# Patient Record
Sex: Female | Born: 1950 | Race: White | Hispanic: No | Marital: Married | Smoking: Never smoker
Health system: Southern US, Community
[De-identification: ages and names within clinical notes are randomized; demographics above are authoritative.]

## PROBLEM LIST (undated history)

## (undated) DIAGNOSIS — E785 Hyperlipidemia, unspecified: Secondary | ICD-10-CM

## (undated) DIAGNOSIS — B029 Zoster without complications: Secondary | ICD-10-CM

## (undated) DIAGNOSIS — I10 Essential (primary) hypertension: Secondary | ICD-10-CM

## (undated) DIAGNOSIS — L570 Actinic keratosis: Secondary | ICD-10-CM

## (undated) DIAGNOSIS — C4491 Basal cell carcinoma of skin, unspecified: Secondary | ICD-10-CM

## (undated) DIAGNOSIS — C4490 Unspecified malignant neoplasm of skin, unspecified: Secondary | ICD-10-CM

## (undated) DIAGNOSIS — N390 Urinary tract infection, site not specified: Secondary | ICD-10-CM

## (undated) DIAGNOSIS — G629 Polyneuropathy, unspecified: Secondary | ICD-10-CM

## (undated) DIAGNOSIS — A419 Sepsis, unspecified organism: Secondary | ICD-10-CM

## (undated) HISTORY — PX: BLADDER SURGERY: SHX569

## (undated) HISTORY — PX: SKIN CANCER EXCISION: SHX779

## (undated) HISTORY — PX: APPENDECTOMY: SHX54

## (undated) HISTORY — PX: ABDOMINAL HYSTERECTOMY: SHX81

---

## 1999-04-22 ENCOUNTER — Other Ambulatory Visit: Admission: RE | Admit: 1999-04-22 | Discharge: 1999-04-22 | Payer: Self-pay

## 2014-06-16 ENCOUNTER — Emergency Department (HOSPITAL_BASED_OUTPATIENT_CLINIC_OR_DEPARTMENT_OTHER): Payer: Medicare Other

## 2014-06-16 ENCOUNTER — Emergency Department (HOSPITAL_BASED_OUTPATIENT_CLINIC_OR_DEPARTMENT_OTHER)
Admission: EM | Admit: 2014-06-16 | Discharge: 2014-06-16 | Disposition: A | Discharge status: Home / Self Care | Payer: Medicare Other | Attending: Emergency Medicine | Admitting: Emergency Medicine

## 2014-06-16 ENCOUNTER — Encounter (HOSPITAL_BASED_OUTPATIENT_CLINIC_OR_DEPARTMENT_OTHER): Payer: Self-pay

## 2014-06-16 DIAGNOSIS — Z85828 Personal history of other malignant neoplasm of skin: Secondary | ICD-10-CM | POA: Insufficient documentation

## 2014-06-16 DIAGNOSIS — Y998 Other external cause status: Secondary | ICD-10-CM | POA: Diagnosis not present

## 2014-06-16 DIAGNOSIS — Z8669 Personal history of other diseases of the nervous system and sense organs: Secondary | ICD-10-CM | POA: Insufficient documentation

## 2014-06-16 DIAGNOSIS — Y9389 Activity, other specified: Secondary | ICD-10-CM | POA: Diagnosis not present

## 2014-06-16 DIAGNOSIS — I10 Essential (primary) hypertension: Secondary | ICD-10-CM | POA: Diagnosis not present

## 2014-06-16 DIAGNOSIS — Z720 Tobacco use: Secondary | ICD-10-CM | POA: Insufficient documentation

## 2014-06-16 DIAGNOSIS — Y9289 Other specified places as the place of occurrence of the external cause: Secondary | ICD-10-CM | POA: Diagnosis not present

## 2014-06-16 DIAGNOSIS — S0990XA Unspecified injury of head, initial encounter: Secondary | ICD-10-CM | POA: Diagnosis present

## 2014-06-16 DIAGNOSIS — Z79899 Other long term (current) drug therapy: Secondary | ICD-10-CM | POA: Insufficient documentation

## 2014-06-16 DIAGNOSIS — W19XXXA Unspecified fall, initial encounter: Secondary | ICD-10-CM

## 2014-06-16 DIAGNOSIS — W109XXA Fall (on) (from) unspecified stairs and steps, initial encounter: Secondary | ICD-10-CM | POA: Diagnosis not present

## 2014-06-16 HISTORY — DX: Unspecified malignant neoplasm of skin, unspecified: C44.90

## 2014-06-16 HISTORY — DX: Polyneuropathy, unspecified: G62.9

## 2014-06-16 HISTORY — DX: Essential (primary) hypertension: I10

## 2014-06-16 NOTE — ED Notes (Addendum)
Tripped up stairs last night-struck front of head on tile-?LOC-abrasion to right forehead-dizzy this am-denies n/v, gait disturbance

## 2014-06-16 NOTE — Discharge Instructions (Signed)
Please follow the directions provided.  Be sure to follow-up with your primary care provider to ensure you are getting better.  You CT scans were negative for any fracture or abnormality.  You may take tylenol for pain. Don't hesitate to return for any new, worsening or concerning symptoms.     WHEN SHOULD I SEEK IMMEDIATE MEDICAL CARE?  You should get help right away if:  You have confusion or drowsiness.  You feel sick to your stomach (nauseous) or have continued, forceful vomiting.  You have dizziness or unsteadiness that is getting worse.  You have severe, continued headaches not relieved by medicine. Only take over-the-counter or prescription medicines for pain, fever, or discomfort as directed by your health care provider.  You do not have normal function of the arms or legs or are unable to walk.  You notice changes in the black spots in the center of the colored part of your eye (pupil).  You have a clear or bloody fluid coming from your nose or ears.  You have a loss of vision. During the next 24 hours after the injury, you must stay with someone who can watch you for the warning signs. This person should contact local emergency services (911 in the [place] if you have seizures, you become unconscious, or you are unable to wake up.

## 2014-06-16 NOTE — ED Provider Notes (Signed)
CSN: [id number]     Arrival date & time 06/16/14  1759 History   First MD Initiated Contact with Patient 06/16/14 1929     Chief Complaint  Patient presents with  . Head Injury   (Consider location/radiation/quality/duration/timing/severity/associated sxs/prior Treatment) HPI Lauren Travis is a 63 yo female presenting with report of tripping while walking up the steps and hitting her right cheek and forehead. She denies any dizziness when she tripped but reports mis-stepping while climbing stairs. Her daughter noticed she had bruising to her forehead and encouraged her to come in today to be evaluated. She is not on any blood thinners and denies any chest pain, shortness of breath, light-headedness,  LOC, nausea, vomiting, blurred vision, or headache.   Past Medical History  Diagnosis Date  . Peripheral neuropathy   . Skin cancer   . Hypertension    Past Surgical History  Procedure Laterality Date  . Abdominal hysterectomy    . Bladder surgery    . Appendectomy     No family history on file. History  Substance Use Topics  . Smoking status: Current Every Day Smoker  . Smokeless tobacco: Not on file  . Alcohol Use: Yes   OB History    No data available     Review of Systems  Constitutional: Negative for fever and chills.  HENT: Negative for sore throat.   Eyes: Negative for visual disturbance.  Respiratory: Negative for cough and shortness of breath.   Cardiovascular: Negative for chest pain and leg swelling.  Gastrointestinal: Negative for nausea, vomiting and diarrhea.  Genitourinary: Negative for dysuria.  Musculoskeletal: Negative for myalgias.  Skin: Positive for color change. Negative for rash.  Neurological: Negative for weakness, numbness and headaches.      Allergies  Morphine and related  Home Medications   Prior to Admission medications   Medication Sig Start Date End Date Taking? Authorizing Provider  Estrogens Conjugated (PREMARIN PO) Take by  mouth.   Yes Historical Provider, MD  Gabapentin (NEURONTIN PO) Take by mouth.   Yes Historical Provider, MD  PROPRANOLOL HCL PO Take by mouth.   Yes Historical Provider, MD   BP 146/88 mmHg  Pulse 58  Temp(Src) 98 F (36.7 C) (Oral)  Resp 18  Ht 5\' 3"  (1.6 m)  Wt 134 lb (60.782 kg)  BMI 23.74 kg/m2  SpO2 100% Physical Exam  Constitutional: She is oriented to person, place, and time. She appears well-developed and well-nourished. No distress.  HENT:  Head: Normocephalic. Head is with contusion.    Mouth/Throat: Oropharynx is clear and moist. No oropharyngeal exudate.  Eyes: Conjunctivae are normal. Pupils are equal, round, and reactive to light.  Neck: Neck supple. No thyromegaly present.  Cardiovascular: Normal rate, regular rhythm and intact distal pulses.   Pulmonary/Chest: Effort normal and breath sounds normal. No respiratory distress. She has no wheezes. She has no rales. She exhibits no tenderness.  Abdominal: Soft. There is no tenderness.  Musculoskeletal: She exhibits no tenderness.  Lymphadenopathy:    She has no cervical adenopathy.  Neurological: She is alert and oriented to person, place, and time. She has normal strength. No cranial nerve deficit or sensory deficit. She exhibits normal muscle tone. Coordination normal. GCS eye subscore is 4. GCS verbal subscore is 5. GCS motor subscore is 6.  Cranial nerves 2-12 intact.  Skin: Skin is warm and dry. No rash noted. She is not diaphoretic.  Psychiatric: She has a normal mood and affect.  Nursing note and vitals  reviewed.   ED Course  Procedures (including critical care time) Labs Review Labs Reviewed - No data to display  Imaging Review EXAM: CT HEAD WITHOUT CONTRAST  CT MAXILLOFACIAL WITHOUT CONTRAST  TECHNIQUE: Multidetector CT imaging of the head and maxillofacial structures were performed using the standard protocol without intravenous contrast. Multiplanar CT image reconstructions of the  maxillofacial structures were also generated.  COMPARISON: None.  FINDINGS: CT HEAD FINDINGS  No evidence of parenchymal hemorrhage or extra-axial fluid collection. No mass lesion, mass effect, or midline shift.  No CT evidence of acute infarction.  Mild prominence of the bifrontal extra-axial spaces. No ventriculomegaly.  The visualized paranasal sinuses are essentially clear. The mastoid air cells are unopacified.  No evidence of calvarial fracture.  CT MAXILLOFACIAL FINDINGS  No evidence of maxillofacial fracture.  The bilateral orbits, including the globes and retroconal soft tissues, are within normal limits.  The mandible is intact. The bilateral mandibular condyles are well-seated in the TMJs.  The visualized paranasal sinuses are essentially clear. The mastoid air cells are unopacified.  Cervical spine is intact to C6-7.  IMPRESSION: No evidence of acute intracranial abnormality.  No evidence of maxillofacial fracture.    EKG Interpretation None      MDM   Final diagnoses:  Fall   63 yo with bruising to rt zygoma and rt forehead after fall from standing.  No report of chest pain, shortness of breath or syncope related to fall.  CT head and maxillofacial negative for acute abnormality. Pt is alert and without complaints.  Vital signs stable and no acute ditsress noted. Head injury sheet provided.  Pt encouraged to follow-up with her PCP.  Return precautions provided.  Pt aware of plan and in agreement.     Filed Vitals:   06/16/14 1818 06/16/14 2029 06/16/14 2128  BP: 146/88 138/79 131/78  Pulse: 58 47 50  Temp: 98 F (36.7 C) 97.6 F (36.4 C) 97.7 F (36.5 C)  TempSrc: Oral Oral Oral  Resp: 18 18 20   Height: 5\' 3"  (1.6 m)    Weight: 134 lb (60.782 kg)    SpO2: 100% 99% 99%   Meds given in ED:  Medications - No data to display  Discharge Medication List as of 06/16/2014  9:26 PM         Britt Bottom, NP 06/18/14  [place], MD 06/24/14 [phone]

## 2019-08-19 ENCOUNTER — Ambulatory Visit: Payer: Self-pay

## 2021-02-15 HISTORY — PX: ANTERIOR (CYSTOCELE) AND POSTERIOR REPAIR (RECTOCELE) WITH XENFORM GRAFT AND SACROSPINOUS FIXATION: SHX6492

## 2021-05-26 ENCOUNTER — Other Ambulatory Visit: Payer: Self-pay

## 2021-05-26 ENCOUNTER — Encounter (HOSPITAL_BASED_OUTPATIENT_CLINIC_OR_DEPARTMENT_OTHER): Payer: Self-pay

## 2021-05-26 ENCOUNTER — Inpatient Hospital Stay (HOSPITAL_BASED_OUTPATIENT_CLINIC_OR_DEPARTMENT_OTHER)
Admission: EM | Admit: 2021-05-26 | Discharge: 2021-05-28 | DRG: 872 | Disposition: A | Discharge status: Home / Self Care | Payer: Medicare Other | Attending: Internal Medicine | Admitting: Internal Medicine

## 2021-05-26 ENCOUNTER — Emergency Department (HOSPITAL_BASED_OUTPATIENT_CLINIC_OR_DEPARTMENT_OTHER): Payer: Medicare Other

## 2021-05-26 DIAGNOSIS — E782 Mixed hyperlipidemia: Secondary | ICD-10-CM | POA: Diagnosis present

## 2021-05-26 DIAGNOSIS — I10 Essential (primary) hypertension: Secondary | ICD-10-CM | POA: Diagnosis present

## 2021-05-26 DIAGNOSIS — B029 Zoster without complications: Secondary | ICD-10-CM | POA: Diagnosis present

## 2021-05-26 DIAGNOSIS — R6510 Systemic inflammatory response syndrome (SIRS) of non-infectious origin without acute organ dysfunction: Secondary | ICD-10-CM | POA: Diagnosis present

## 2021-05-26 DIAGNOSIS — R509 Fever, unspecified: Principal | ICD-10-CM

## 2021-05-26 DIAGNOSIS — N390 Urinary tract infection, site not specified: Secondary | ICD-10-CM

## 2021-05-26 DIAGNOSIS — E876 Hypokalemia: Secondary | ICD-10-CM | POA: Diagnosis present

## 2021-05-26 DIAGNOSIS — A419 Sepsis, unspecified organism: Secondary | ICD-10-CM | POA: Diagnosis present

## 2021-05-26 DIAGNOSIS — N3000 Acute cystitis without hematuria: Secondary | ICD-10-CM | POA: Diagnosis present

## 2021-05-26 DIAGNOSIS — E871 Hypo-osmolality and hyponatremia: Secondary | ICD-10-CM | POA: Diagnosis present

## 2021-05-26 DIAGNOSIS — Z79899 Other long term (current) drug therapy: Secondary | ICD-10-CM

## 2021-05-26 DIAGNOSIS — G629 Polyneuropathy, unspecified: Secondary | ICD-10-CM | POA: Diagnosis present

## 2021-05-26 DIAGNOSIS — A4181 Sepsis due to Enterococcus: Secondary | ICD-10-CM | POA: Diagnosis not present

## 2021-05-26 DIAGNOSIS — Z20822 Contact with and (suspected) exposure to covid-19: Secondary | ICD-10-CM | POA: Diagnosis present

## 2021-05-26 DIAGNOSIS — E869 Volume depletion, unspecified: Secondary | ICD-10-CM | POA: Diagnosis present

## 2021-05-26 DIAGNOSIS — Z90710 Acquired absence of both cervix and uterus: Secondary | ICD-10-CM

## 2021-05-26 DIAGNOSIS — Z85828 Personal history of other malignant neoplasm of skin: Secondary | ICD-10-CM

## 2021-05-26 DIAGNOSIS — R7303 Prediabetes: Secondary | ICD-10-CM | POA: Diagnosis present

## 2021-05-26 HISTORY — DX: Hyperlipidemia, unspecified: E78.5

## 2021-05-26 HISTORY — DX: Zoster without complications: B02.9

## 2021-05-26 HISTORY — DX: Essential (primary) hypertension: I10

## 2021-05-26 HISTORY — DX: Basal cell carcinoma of skin, unspecified: C44.91

## 2021-05-26 HISTORY — DX: Actinic keratosis: L57.0

## 2021-05-26 LAB — COMPREHENSIVE METABOLIC PANEL
ALT: 29 U/L (ref 0–44)
AST: 26 U/L (ref 15–41)
Albumin: 4.2 g/dL (ref 3.5–5.0)
Alkaline Phosphatase: 53 U/L (ref 38–126)
Anion gap: 14 (ref 5–15)
BUN: 12 mg/dL (ref 8–23)
CO2: 25 mmol/L (ref 22–32)
Calcium: 9.5 mg/dL (ref 8.9–10.3)
Chloride: 93 mmol/L — ABNORMAL LOW (ref 98–111)
Creatinine, Ser: 0.87 mg/dL (ref 0.44–1.00)
GFR, Estimated: >60 mL/min (ref >60)
Glucose, Bld: 139 mg/dL — ABNORMAL HIGH (ref 70–99)
Potassium: 3.1 mmol/L — ABNORMAL LOW (ref 3.5–5.1)
Sodium: 132 mmol/L — ABNORMAL LOW (ref 135–145)
Total Bilirubin: 0.9 mg/dL (ref 0.3–1.2)
Total Protein: 8.1 g/dL (ref 6.5–8.1)

## 2021-05-26 LAB — RAPID INFLUENZA A&B ANTIGENS
Influenza A (ARMC): NEGATIVE
Influenza B (ARMC): NEGATIVE

## 2021-05-26 LAB — CBC WITH DIFFERENTIAL/PLATELET
Abs Immature Granulocytes: 0.07 10*3/uL (ref 0.00–0.07)
Basophils Absolute: 0 10*3/uL (ref 0.0–0.1)
Basophils Relative: 0 %
Eosinophils Absolute: 0 10*3/uL (ref 0.0–0.5)
Eosinophils Relative: 0 %
HCT: 40.5 % (ref 36.0–46.0)
Hemoglobin: 13.7 g/dL (ref 12.0–15.0)
Immature Granulocytes: 1 %
Lymphocytes Relative: 13 %
Lymphs Abs: 1.7 10*3/uL (ref 0.7–4.0)
MCH: 30.4 pg (ref 26.0–34.0)
MCHC: 33.8 g/dL (ref 30.0–36.0)
MCV: 89.8 fL (ref 80.0–100.0)
Monocytes Absolute: 1 10*3/uL (ref 0.1–1.0)
Monocytes Relative: 8 %
Neutro Abs: 10.5 10*3/uL — ABNORMAL HIGH (ref 1.7–7.7)
Neutrophils Relative %: 78 %
Platelets: 181 10*3/uL (ref 150–400)
RBC: 4.51 MIL/uL (ref 3.87–5.11)
RDW: 13.9 % (ref 11.5–15.5)
WBC: 13.3 10*3/uL — ABNORMAL HIGH (ref 4.0–10.5)
nRBC: 0 % (ref 0.0–0.2)

## 2021-05-26 LAB — URINALYSIS, ROUTINE W REFLEX MICROSCOPIC
Bilirubin Urine: NEGATIVE
Glucose, UA: NEGATIVE mg/dL
Ketones, ur: NEGATIVE mg/dL
Nitrite: POSITIVE — AB
Protein, ur: NEGATIVE mg/dL
Specific Gravity, Urine: 1.015 (ref 1.005–1.030)
pH: 5.5 (ref 5.0–8.0)

## 2021-05-26 LAB — RESP PANEL BY RT-PCR (FLU A&B, COVID) ARPGX2
Influenza A by PCR: NEGATIVE
Influenza B by PCR: NEGATIVE
SARS Coronavirus 2 by RT PCR: NEGATIVE

## 2021-05-26 LAB — URINALYSIS, MICROSCOPIC (REFLEX)

## 2021-05-26 MED ORDER — ONDANSETRON HCL 4 MG/2ML IJ SOLN
4.0000 mg | Freq: Once | INTRAMUSCULAR | Status: AC
  Administered 2021-05-26: 4 mg via INTRAVENOUS
  Filled 2021-05-26: qty 2

## 2021-05-26 MED ORDER — ACETAMINOPHEN 325 MG PO TABS
650.0000 mg | ORAL_TABLET | Freq: Once | ORAL | Status: AC
  Administered 2021-05-26: 650 mg via ORAL
  Filled 2021-05-26: qty 2

## 2021-05-26 MED ORDER — SODIUM CHLORIDE 0.9 % IV BOLUS
500.0000 mL | Freq: Once | INTRAVENOUS | Status: AC
  Administered 2021-05-26: 500 mL via INTRAVENOUS

## 2021-05-26 MED ORDER — SODIUM CHLORIDE 0.9 % IV SOLN
1.0000 g | Freq: Once | INTRAVENOUS | Status: AC
  Administered 2021-05-26: 1 g via INTRAVENOUS
  Filled 2021-05-26: qty 10

## 2021-05-26 NOTE — Plan of Care (Signed)
[facility] Plan of Care Note  Patient: Lexii Walsh    TGR:[id number]  PCP: Verdell Carmine., MD    DOB: 04/02/1951  DOS: 05/26/2021   Received a phone call from Facility: [facility] , regarding transfer of Ms. Lorene Dy. Requesting: EDP Reason for transfer: admission History: recently diagnosed with shingles, presented with fever, shortness of breath, cough and lethargy.   Work up: UA nitrite positive, febrile, flu negative Treatment received: fluids and ceftriaxone  Plan of care: The patient will be accepted for admission to [facility] unit, [facility] hospital,. Airborn isolation for shingles.   Author: Berle Mull, MD [facility] 05/26/2021  If 7PM-7AM, please contact night-coverage at [email or web address],

## 2021-05-26 NOTE — ED Provider Notes (Signed)
[facility] EMERGENCY DEPARTMENT Provider Note   CSN: [id number] Arrival date & time: 05/26/21  1117     History Chief Complaint  Patient presents with   Fever    Lauren Travis is a 70 y.o. female.  Presents to ER with concern for fever.  Patient reports that on Thursday she was diagnosed with shingles, started on Valtrex.  Taking medication as prescribed.  Has rash to left chest wall.  Last night patient woke up with fever of 101, having chills, sweats.  Also having some nausea and feeling somewhat short of breath.  Symptoms progressed today.  Also having some burning with urination, foul-smelling urine.  HPI     Past Medical History:  Diagnosis Date   Actinic keratosis    Basal cell carcinoma    HTN (hypertension)    Hyperlipidemia    Shingles     Patient Active Problem List   Diagnosis Date Noted   UTI (urinary tract infection) 05/26/2021     OB History   No obstetric history on file.     No family history on file.  Social History   Tobacco Use   Smoking status: Never   Smokeless tobacco: Never  Vaping Use   Vaping Use: Never used  Substance Use Topics   Alcohol use: Never   Drug use: Never    Home Medications Prior to Admission medications   Not on File    Allergies    Codeine  Review of Systems   Review of Systems  Constitutional:  Positive for chills, fatigue and fever.  HENT:  Negative for ear pain and sore throat.   Eyes:  Negative for pain and visual disturbance.  Respiratory:  Negative for cough and shortness of breath.   Cardiovascular:  Negative for chest pain and palpitations.  Gastrointestinal:  Negative for abdominal pain and vomiting.  Genitourinary:  Positive for dysuria and frequency. Negative for hematuria.  Musculoskeletal:  Negative for arthralgias and back pain.  Skin:  Negative for color change and rash.  Neurological:  Negative for seizures and syncope.  All other systems reviewed and are negative.  Physical  Exam Updated Vital Signs BP (!) 120/58   Pulse 67   Temp (!) 100.7 F (38.2 C) (Oral)   Resp (!) 24   Ht 5\' 2"  (1.575 m)   Wt 63.5 kg   SpO2 94%   BMI 25.61 kg/m   Physical Exam Vitals and nursing note reviewed.  Constitutional:      General: She is not in acute distress.    Appearance: She is well-developed.  HENT:     Head: Normocephalic and atraumatic.  Eyes:     Conjunctiva/sclera: Conjunctivae normal.  Cardiovascular:     Rate and Rhythm: Normal rate and regular rhythm.     Heart sounds: No murmur heard. Pulmonary:     Effort: Pulmonary effort is normal. No respiratory distress.     Breath sounds: Normal breath sounds.  Abdominal:     Palpations: Abdomen is soft.     Tenderness: There is no abdominal tenderness.  Musculoskeletal:        General: No deformity or signs of injury.     Cervical back: Neck supple.  Skin:    Comments: Left chest wall rash present in dermatomal pattern  Neurological:     General: No focal deficit present.     Mental Status: She is alert.  Psychiatric:        Mood and Affect: Mood normal.  ED Results / Procedures / Treatments   Labs (all labs ordered are listed, but only abnormal results are displayed) Labs Reviewed  CBC WITH DIFFERENTIAL/PLATELET - Abnormal; Notable for the following components:      Result Value   WBC 13.3 (*)    Neutro Abs 10.5 (*)    All other components within normal limits  COMPREHENSIVE METABOLIC PANEL - Abnormal; Notable for the following components:   Sodium 132 (*)    Potassium 3.1 (*)    Chloride 93 (*)    Glucose, Bld 139 (*)    All other components within normal limits  URINALYSIS, ROUTINE W REFLEX MICROSCOPIC - Abnormal; Notable for the following components:   APPearance CLOUDY (*)    Hgb urine dipstick TRACE (*)    Nitrite POSITIVE (*)    Leukocytes,Ua MODERATE (*)    All other components within normal limits  URINALYSIS, MICROSCOPIC (REFLEX) - Abnormal; Notable for the following  components:   Bacteria, UA MANY (*)    All other components within normal limits  RAPID INFLUENZA A&B ANTIGENS  CULTURE, BLOOD (ROUTINE X 2)  CULTURE, BLOOD (ROUTINE X 2)  URINE CULTURE  RESP PANEL BY RT-PCR (FLU A&B, COVID) [id number]    EKG EKG Interpretation  Date/Time:  Sunday May 26 2021 12:01:58 EST Ventricular Rate:  75 PR Interval:  163 QRS Duration: 96 QT Interval:  391 QTC Calculation: 437 R Axis:   -24 Text Interpretation: Sinus rhythm Borderline left axis deviation Confirmed by Guy Seese ([id number]) on 05/26/2021 1:39:41 PM  Radiology DG Chest Portable 1 View  Result Date: 05/26/2021 CLINICAL DATA:  Fever. EXAM: PORTABLE CHEST 1 VIEW COMPARISON:  Chest CT May 15, 2021 FINDINGS: Tortuosity and calcific atherosclerotic disease of the aorta. Cardiomediastinal silhouette is normal. Mediastinal contours appear intact. There is no evidence of focal airspace consolidation, pleural effusion or pneumothorax. Osseous structures are without acute abnormality. Soft tissues are grossly normal. IMPRESSION: No active disease. Electronically Signed   By: Dobrinka  Dimitrova M.D.   On: 05/26/2021 13:28    Procedures Procedures   Medications Ordered in ED Medications  sodium chloride 0.9 % bolus 500 mL (0 mLs Intravenous Stopped 05/26/21 1532)  acetaminophen (TYLENOL) tablet 650 mg (650 mg Oral Given 05/26/21 1411)  cefTRIAXone (ROCEPHIN) 1 g in sodium chloride 0.9 % 100 mL IVPB (0 g Intravenous Stopped 05/26/21 1532)    ED Course  I have reviewed the triage vital signs and the nursing notes.  Pertinent labs & imaging results that were available during my care of the patient were reviewed by me and considered in my medical decision making (see chart for details).    MDM Rules/Calculators/A&P                           70  year old lady presents here with concern for fever.  On arrival to ER vital stable, mild tachypnea..  Somewhat lethargic but not in distress.  Work-up  noted for leukocytosis, urinary tract infection.  Later noted to have fever.  Started on antibiotics.  Given her fever, lethargy, will admit to hospitalist for further management.  Also of note patient had recent shingles diagnosis and primary doctor had started on Valtrex.  Placed on airborne precautions per recommendation from admitting hospitalist. [facility] Pronto accepting.  Final Clinical Impression(s) / ED Diagnoses Final diagnoses:  Fever, unspecified fever cause  Urinary tract infection without hematuria, site unspecified  Herpes zoster without complication    Rx / DC Orders  ED Discharge Orders     None        Lucrezia Starch, MD 05/26/21 1542

## 2021-05-26 NOTE — ED Triage Notes (Incomplete)
Pt actively vomiting in triage 

## 2021-05-26 NOTE — ED Notes (Signed)
Positive for Shingles

## 2021-05-26 NOTE — ED Notes (Signed)
Pt pending bed placed at [facility]. Pt recurrent fever. Discussed pt with EDP Zackowski. Verbal order for additional dose of tylenol obtained.

## 2021-05-26 NOTE — ED Triage Notes (Signed)
Diagnosed with Shingles Thursday on Valtrex. Awoke last night with Fever 101.  Chills, sweats, nausea, shortness of breath that has gotten progressively worse over the past 12 hours.  Urine is cloudy with foul odor.

## 2021-05-26 NOTE — ED Notes (Addendum)
Pt has shingles to left chest and wraps around side. States she was diagnosed less than a week ago. Pt placed on contact and airborne precautions at this time.

## 2021-05-27 ENCOUNTER — Encounter (HOSPITAL_COMMUNITY): Payer: Self-pay | Admitting: Internal Medicine

## 2021-05-27 DIAGNOSIS — R6510 Systemic inflammatory response syndrome (SIRS) of non-infectious origin without acute organ dysfunction: Secondary | ICD-10-CM | POA: Diagnosis present

## 2021-05-27 DIAGNOSIS — I10 Essential (primary) hypertension: Secondary | ICD-10-CM | POA: Diagnosis present

## 2021-05-27 DIAGNOSIS — E782 Mixed hyperlipidemia: Secondary | ICD-10-CM | POA: Diagnosis present

## 2021-05-27 DIAGNOSIS — B029 Zoster without complications: Secondary | ICD-10-CM | POA: Diagnosis present

## 2021-05-27 DIAGNOSIS — E876 Hypokalemia: Secondary | ICD-10-CM | POA: Diagnosis present

## 2021-05-27 DIAGNOSIS — A419 Sepsis, unspecified organism: Secondary | ICD-10-CM | POA: Diagnosis present

## 2021-05-27 DIAGNOSIS — E871 Hypo-osmolality and hyponatremia: Secondary | ICD-10-CM | POA: Diagnosis present

## 2021-05-27 DIAGNOSIS — A4181 Sepsis due to Enterococcus: Secondary | ICD-10-CM | POA: Diagnosis present

## 2021-05-27 DIAGNOSIS — N3000 Acute cystitis without hematuria: Secondary | ICD-10-CM

## 2021-05-27 DIAGNOSIS — Z85828 Personal history of other malignant neoplasm of skin: Secondary | ICD-10-CM | POA: Diagnosis not present

## 2021-05-27 DIAGNOSIS — Z90710 Acquired absence of both cervix and uterus: Secondary | ICD-10-CM | POA: Diagnosis not present

## 2021-05-27 DIAGNOSIS — Z20822 Contact with and (suspected) exposure to covid-19: Secondary | ICD-10-CM | POA: Diagnosis present

## 2021-05-27 DIAGNOSIS — R509 Fever, unspecified: Secondary | ICD-10-CM | POA: Diagnosis present

## 2021-05-27 DIAGNOSIS — N390 Urinary tract infection, site not specified: Secondary | ICD-10-CM | POA: Diagnosis present

## 2021-05-27 DIAGNOSIS — E869 Volume depletion, unspecified: Secondary | ICD-10-CM | POA: Diagnosis present

## 2021-05-27 DIAGNOSIS — G629 Polyneuropathy, unspecified: Secondary | ICD-10-CM | POA: Diagnosis present

## 2021-05-27 DIAGNOSIS — R7303 Prediabetes: Secondary | ICD-10-CM | POA: Diagnosis present

## 2021-05-27 DIAGNOSIS — Z79899 Other long term (current) drug therapy: Secondary | ICD-10-CM | POA: Diagnosis not present

## 2021-05-27 LAB — CBC WITH DIFFERENTIAL/PLATELET
Abs Immature Granulocytes: 0.06 10*3/uL (ref 0.00–0.07)
Basophils Absolute: 0 10*3/uL (ref 0.0–0.1)
Basophils Relative: 0 %
Eosinophils Absolute: 0 10*3/uL (ref 0.0–0.5)
Eosinophils Relative: 0 %
HCT: 35.6 % — ABNORMAL LOW (ref 36.0–46.0)
Hemoglobin: 12 g/dL (ref 12.0–15.0)
Immature Granulocytes: 1 %
Lymphocytes Relative: 20 %
Lymphs Abs: 2.4 10*3/uL (ref 0.7–4.0)
MCH: 30.2 pg (ref 26.0–34.0)
MCHC: 33.7 g/dL (ref 30.0–36.0)
MCV: 89.7 fL (ref 80.0–100.0)
Monocytes Absolute: 1.3 10*3/uL — ABNORMAL HIGH (ref 0.1–1.0)
Monocytes Relative: 11 %
Neutro Abs: 8.3 10*3/uL — ABNORMAL HIGH (ref 1.7–7.7)
Neutrophils Relative %: 68 %
Platelets: 154 10*3/uL (ref 150–400)
RBC: 3.97 MIL/uL (ref 3.87–5.11)
RDW: 14.1 % (ref 11.5–15.5)
WBC: 12 10*3/uL — ABNORMAL HIGH (ref 4.0–10.5)
nRBC: 0 % (ref 0.0–0.2)

## 2021-05-27 LAB — COMPREHENSIVE METABOLIC PANEL
ALT: 20 U/L (ref 0–44)
AST: 19 U/L (ref 15–41)
Albumin: 3.4 g/dL — ABNORMAL LOW (ref 3.5–5.0)
Alkaline Phosphatase: 39 U/L (ref 38–126)
Anion gap: 11 (ref 5–15)
BUN: 9 mg/dL (ref 8–23)
CO2: 27 mmol/L (ref 22–32)
Calcium: 8.9 mg/dL (ref 8.9–10.3)
Chloride: 96 mmol/L — ABNORMAL LOW (ref 98–111)
Creatinine, Ser: 0.78 mg/dL (ref 0.44–1.00)
GFR, Estimated: >60 mL/min (ref >60)
Glucose, Bld: 127 mg/dL — ABNORMAL HIGH (ref 70–99)
Potassium: 2.9 mmol/L — ABNORMAL LOW (ref 3.5–5.1)
Sodium: 134 mmol/L — ABNORMAL LOW (ref 135–145)
Total Bilirubin: 0.5 mg/dL (ref 0.3–1.2)
Total Protein: 7.1 g/dL (ref 6.5–8.1)

## 2021-05-27 LAB — MAGNESIUM: Magnesium: 1.5 mg/dL — ABNORMAL LOW (ref 1.7–2.4)

## 2021-05-27 MED ORDER — LACTATED RINGERS IV SOLN: INTRAVENOUS | Status: AC

## 2021-05-27 MED ORDER — SODIUM CHLORIDE 0.9 % IV SOLN
1.0000 g | INTRAVENOUS | Status: DC
  Administered 2021-05-27: 1 g via INTRAVENOUS
  Filled 2021-05-27 (×2): qty 10

## 2021-05-27 MED ORDER — PROPRANOLOL HCL ER 60 MG PO CP24
60.0000 mg | ORAL_CAPSULE | Freq: Every day | ORAL | Status: DC
  Filled 2021-05-27 (×2): qty 1

## 2021-05-27 MED ORDER — GABAPENTIN 300 MG PO CAPS
900.0000 mg | ORAL_CAPSULE | Freq: Every day | ORAL | Status: DC
  Administered 2021-05-27 – 2021-05-28 (×6): 900 mg via ORAL
  Administered 2021-05-28: 600 mg via ORAL
  Filled 2021-05-27 (×7): qty 3

## 2021-05-27 MED ORDER — ONDANSETRON HCL 4 MG/2ML IJ SOLN: 4.0000 mg | Freq: Four times a day (QID) | INTRAMUSCULAR | Status: DC | PRN

## 2021-05-27 MED ORDER — POTASSIUM CHLORIDE CRYS ER 20 MEQ PO TBCR
40.0000 meq | EXTENDED_RELEASE_TABLET | ORAL | Status: AC
  Administered 2021-05-27 (×2): 40 meq via ORAL
  Filled 2021-05-27 (×2): qty 2

## 2021-05-27 MED ORDER — HYDRALAZINE HCL 20 MG/ML IJ SOLN: 10.0000 mg | Freq: Four times a day (QID) | INTRAMUSCULAR | Status: DC | PRN

## 2021-05-27 MED ORDER — ACETAMINOPHEN 325 MG PO TABS
650.0000 mg | ORAL_TABLET | Freq: Four times a day (QID) | ORAL | Status: DC | PRN
  Administered 2021-05-27 – 2021-05-28 (×2): 650 mg via ORAL
  Filled 2021-05-27 (×2): qty 2

## 2021-05-27 MED ORDER — LACTATED RINGERS IV BOLUS
500.0000 mL | Freq: Once | INTRAVENOUS | Status: AC
  Administered 2021-05-27: 500 mL via INTRAVENOUS

## 2021-05-27 MED ORDER — VALACYCLOVIR HCL 500 MG PO TABS
1000.0000 mg | ORAL_TABLET | Freq: Two times a day (BID) | ORAL | Status: AC
  Administered 2021-05-27 (×2): 1000 mg via ORAL
  Filled 2021-05-27 (×2): qty 2

## 2021-05-27 MED ORDER — ACETAMINOPHEN 650 MG RE SUPP: 650.0000 mg | Freq: Four times a day (QID) | RECTAL | Status: DC | PRN

## 2021-05-27 MED ORDER — ENOXAPARIN SODIUM 40 MG/0.4ML IJ SOSY
40.0000 mg | PREFILLED_SYRINGE | INTRAMUSCULAR | Status: DC
  Administered 2021-05-27: 40 mg via SUBCUTANEOUS
  Filled 2021-05-27 (×2): qty 0.4

## 2021-05-27 MED ORDER — POLYETHYLENE GLYCOL 3350 17 G PO PACK: 17.0000 g | PACK | Freq: Every day | ORAL | Status: DC | PRN

## 2021-05-27 MED ORDER — POTASSIUM CHLORIDE CRYS ER 20 MEQ PO TBCR
40.0000 meq | EXTENDED_RELEASE_TABLET | Freq: Once | ORAL | Status: AC
  Administered 2021-05-27: 40 meq via ORAL
  Filled 2021-05-27: qty 2

## 2021-05-27 MED ORDER — MAGNESIUM SULFATE 50 % IJ SOLN
3.0000 g | Freq: Once | INTRAMUSCULAR | Status: AC
  Administered 2021-05-27: 3 g via INTRAVENOUS
  Filled 2021-05-27: qty 6

## 2021-05-27 MED ORDER — ATORVASTATIN CALCIUM 10 MG PO TABS
10.0000 mg | ORAL_TABLET | Freq: Every day | ORAL | Status: DC
  Administered 2021-05-27 – 2021-05-28 (×2): 10 mg via ORAL
  Filled 2021-05-27 (×2): qty 1

## 2021-05-27 MED ORDER — ONDANSETRON HCL 4 MG PO TABS: 4.0000 mg | ORAL_TABLET | Freq: Four times a day (QID) | ORAL | Status: DC | PRN

## 2021-05-27 NOTE — Assessment & Plan Note (Signed)
   Mild hyponatremia likely secondary to volume depletion and hydrochlorothiazide use  Holding hydrochlorothiazide  Gentle intravenous hydration with isotonic fluids  Monitor sodium levels with serial chemistries

## 2021-05-27 NOTE — Assessment & Plan Note (Signed)
   Patient presenting with multiple SIRS criteria including fevers, tachypnea and leukocytosis all thought to be secondary to urinary tract infection  No evidence of concurrent organ dysfunction to suggest sepsis  Remainder of assessment and plan as above

## 2021-05-27 NOTE — Assessment & Plan Note (Signed)
   Replacing with potassium chloride  Holding hydrochlorothiazide  Evaluating for concurrent hypomagnesemia   Monitoring potassium levels with serial chemistries.

## 2021-05-27 NOTE — Assessment & Plan Note (Signed)
.   Holding home regimen of hydrochlorothiazide as patient is currently normotensive and hypokalemic . PRN intravenous antihypertensives for excessively elevated blood pressure

## 2021-05-27 NOTE — H&P (Signed)
History and Physical    Lauren Travis WJX:[id number] DOB: [birth year] DOA: 05/26/2021  PCP: Verdell Carmine., MD  Patient coming from: Home via [facility]   Chief Complaint:  Chief Complaint  Patient presents with   Fever     HPI:    70 year old female with past medical history of hypertension, hyperlipidemia, peripheral neuropathy, vaginal prolapse, insomnia, prediabetes who presented to [facility] emergency department with complaints of fevers as well as cloudy urine with a foul odor.  Of note, patient has recently developed a vesicular rash underneath left breast and along the left chest wall.  Patient was diagnosed with shingles by her outpatient provider on Thursday 11/3.  At that time patient was initiated on a course of oral Valtrex.  Patient has been taking her medication as instructed.  Patient explains that earlier in the evening on 11/5 and into the morning on 11/6 she began to develop intense chills and nausea with bouts of fever as high as 101 F.  Patient symptoms continue to persist throughout the evening and the following day and were associated with generalized malaise nausea and poor appetite.  Patient also notes that she was developing some dysuria with foul smelling cloudy urine.  Due to progressively worsening symptoms patient eventually presented to [facility] emergency department for evaluation on 11/6.  Upon evaluation in the emergency department urinalysis was performed and found to be nitrite and leukocyte esterase positive with 21-50 whites per high-powered field and many bacteria.  Patient was additionally found to have multiple SIRS criteria including a leukocytosis of 13.3, tachypnea, fever of 100.7 F.  ER provider initiated a 500 cc bolus of normal saline and 1 g of intravenous ceftriaxone.  Upon discussion with the accepting hospitalist for transfer to [facility] long hospital additional recommendation was made for the patient be placed in airborne  precautions in case of disseminated zoster.  Review of Systems:   Review of Systems  Gastrointestinal:  Positive for nausea.  Genitourinary:  Positive for dysuria.  Skin:  Positive for rash.  Neurological:  Positive for weakness.  All other systems reviewed and are negative.  Past Medical History:  Diagnosis Date   Actinic keratosis    Basal cell carcinoma    HTN (hypertension)    Hyperlipidemia    Shingles     Past Surgical History:  Procedure Laterality Date   ABDOMINAL HYSTERECTOMY     ANTERIOR (CYSTOCELE) AND POSTERIOR REPAIR (RECTOCELE) WITH XENFORM GRAFT AND SACROSPINOUS FIXATION N/A 02/15/2021   SKIN CANCER EXCISION     multiple lesions face, legs, arms     reports that she has never smoked. She has never used smokeless tobacco. She reports that she does not drink alcohol and does not use drugs.  Allergies  Allergen Reactions   Codeine Itching    Family History  Problem Relation Age of Onset   Heart disease Neg Hx      Prior to Admission medications   Medication Sig Start Date End Date Taking? Authorizing Provider  atorvastatin (LIPITOR) 10 MG tablet Take 10 mg by mouth daily. 04/01/21  Yes [provider]  gabapentin (NEURONTIN) 300 MG capsule Take 900 mg by mouth 5 (five) times daily. 04/01/21  Yes [provider]  hydrochlorothiazide (HYDRODIURIL) 25 MG tablet Take 25 mg by mouth daily. 04/01/21  Yes [provider]  potassium chloride (KLOR-CON) 10 MEQ tablet Take 10 mEq by mouth daily. 01/05/21  Yes [provider]  propranolol ER (INDERAL LA)  60 MG 24 hr capsule Take 60 mg by mouth daily. 04/01/21  Yes [provider]  valACYclovir (VALTREX) 1000 MG tablet Take 1,000 mg by mouth 2 (two) times daily. 05/20/21  Yes [provider]  zolpidem (AMBIEN) 5 MG tablet Take 5 mg by mouth at bedtime as needed. 04/29/21  Yes [provider]    Physical Exam: Vitals:   05/26/21 2148 05/26/21 2344 05/27/21  0000 05/27/21 0118  BP:   (!) 118/58 111/67  Pulse:   61 70  Resp:   (!) 23 19  Temp: 99.8 F (37.7 C) 98.9 F (37.2 C)  98.4 F (36.9 C)  TempSrc: Oral Oral  Oral  SpO2:   95% 96%  Weight:      Height:        Constitutional: Awake alert and oriented x3, no associated distress.   Skin: Notable rash underneath left breast and left lateral chest with most vesicles crusted over and minimal tenderness.  Poor skin turgor noted.  No other rashes or lesions seen.   Eyes: Pupils are equally reactive to light.  No evidence of scleral icterus or conjunctival pallor.  ENMT: Dry mucous membranes noted.  Posterior pharynx clear of any exudate or lesions.   Neck: normal, supple, no masses, no thyromegaly.  No evidence of jugular venous distension.   Respiratory: clear to auscultation bilaterally, no wheezing, no crackles. Normal respiratory effort. No accessory muscle use.  Cardiovascular: Regular rate and rhythm, no murmurs / rubs / gallops. No extremity edema. 2+ pedal pulses. No carotid bruits.  Chest:   Nontender without crepitus or deformity.   Back:   Nontender without crepitus or deformity. Abdomen: Mild lower abdominal tenderness.  Abdomen is soft however.    No evidence of intra-abdominal masses.  Positive bowel sounds noted in all quadrants.   Musculoskeletal: No joint deformity upper and lower extremities. Good ROM, no contractures. Normal muscle tone.  Neurologic: CN 2-12 grossly intact. Sensation intact.  Patient moving all 4 extremities spontaneously.  Patient is following all commands.  Patient is responsive to verbal stimuli.   Psychiatric: Patient exhibits normal mood with appropriate affect.  Patient seems to possess insight as to their current situation.     Labs on Admission: I have personally reviewed following labs and imaging studies -   CBC: Recent Labs  Lab 05/26/21 1235  WBC 13.3*  NEUTROABS 10.5*  HGB 13.7  HCT 40.5  MCV 89.8  PLT 315   Basic Metabolic  Panel: Recent Labs  Lab 05/26/21 1235  NA 132*  K 3.1*  CL 93*  CO2 25  GLUCOSE 139*  BUN 12  CREATININE 0.87  CALCIUM 9.5   GFR: Estimated Creatinine Clearance: 51.2 mL/min (by C-G formula based on SCr of 0.87 mg/dL). Liver Function Tests: Recent Labs  Lab 05/26/21 1235  AST 26  ALT 29  ALKPHOS 53  BILITOT 0.9  PROT 8.1  ALBUMIN 4.2   No results for input(s): LIPASE, AMYLASE in the last 168 hours. No results for input(s): AMMONIA in the last 168 hours. Coagulation Profile: No results for input(s): INR, PROTIME in the last 168 hours. Cardiac Enzymes: No results for input(s): CKTOTAL, CKMB, CKMBINDEX, TROPONINI in the last 168 hours. BNP (last 3 results) No results for input(s): PROBNP in the last 8760 hours. HbA1C: No results for input(s): HGBA1C in the last 72 hours. CBG: No results for input(s): GLUCAP in the last 168 hours. Lipid Profile: No results for input(s): CHOL, HDL, LDLCALC, TRIG, CHOLHDL,  LDLDIRECT in the last 72 hours. Thyroid Function Tests: No results for input(s): TSH, T4TOTAL, FREET4, T3FREE, THYROIDAB in the last 72 hours. Anemia Panel: No results for input(s): VITAMINB12, FOLATE, FERRITIN, TIBC, IRON, RETICCTPCT in the last 72 hours. Urine analysis:    Component Value Date/Time   COLORURINE YELLOW 05/26/2021 1310   APPEARANCEUR CLOUDY (A) 05/26/2021 1310   LABSPEC 1.015 05/26/2021 1310   PHURINE 5.5 05/26/2021 1310   GLUCOSEU NEGATIVE 05/26/2021 1310   HGBUR TRACE (A) 05/26/2021 1310   BILIRUBINUR NEGATIVE 05/26/2021 1310   KETONESUR NEGATIVE 05/26/2021 1310   PROTEINUR NEGATIVE 05/26/2021 1310   NITRITE POSITIVE (A) 05/26/2021 1310   LEUKOCYTESUR MODERATE (A) 05/26/2021 1310    Radiological Exams on Admission - Personally Reviewed: DG Chest Portable 1 View  Result Date: 05/26/2021 CLINICAL DATA:  Fever. EXAM: PORTABLE CHEST 1 VIEW COMPARISON:  Chest CT May 15, 2021 FINDINGS: Tortuosity and calcific atherosclerotic disease of the  aorta. Cardiomediastinal silhouette is normal. Mediastinal contours appear intact. There is no evidence of focal airspace consolidation, pleural effusion or pneumothorax. Osseous structures are without acute abnormality. Soft tissues are grossly normal. IMPRESSION: No active disease. Electronically Signed   By: Fidela Salisbury M.D.   On: 05/26/2021 13:28    EKG: Personally reviewed.  Rhythm is normal sinus rhythm with heart rate of 85 bpm.  No dynamic ST segment changes appreciated.  Assessment/Plan  * Acute cystitis without hematuria Patient presenting with several day history of nausea, chills, weakness and dysuria Patient now exhibiting multiple SIRS criteria including leukocytosis, tachypnea and fever with no evidence of organ dysfunction to suggest sepsis Urinalysis suggestive of urinary tract infection Treating patient with intravenous ceftriaxone Hydrating patient with intravenous isotonic fluid Urine cultures and blood cultures obtained  SIRS (systemic inflammatory response syndrome) ([place]) Patient presenting with multiple SIRS criteria including fevers, tachypnea and leukocytosis all thought to be secondary to urinary tract infection No evidence of concurrent organ dysfunction to suggest sepsis Remainder of assessment and plan as above  Herpes zoster infection Patient recently diagnosed with herpes zoster infection underneath the left breast and left lateral chest wall Continue recently prescribed regimen of valacyclovir On examination, rash seems to be isolated along 1 dermatome no evidence of dissemination With no clinical evidence of dissemination airborne isolation is likely not needed.  Will defer decision on discontinuing isolation to the day team  Essential hypertension Holding home regimen of hydrochlorothiazide as patient is currently normotensive and hypokalemic PRN intravenous antihypertensives for excessively elevated blood pressure    Mixed  hyperlipidemia Continuing home regimen of lipid lowering therapy.   Hypokalemia Replacing with potassium chloride Holding hydrochlorothiazide Evaluating for concurrent hypomagnesemia  Monitoring potassium levels with serial chemistries.    Hyponatremia Mild hyponatremia likely secondary to volume depletion and hydrochlorothiazide use Holding hydrochlorothiazide Gentle intravenous hydration with isotonic fluids Monitor sodium levels with serial chemistries     Code Status:  Full code  code status decision has been confirmed with: patient  Family Communication: deferred   Status is: Observation  The patient remains OBS appropriate and will d/c before 2 midnights.       Vernelle Emerald MD [facility] Pager [phone]  If 7PM-7AM, please contact night-coverage [email or web address] Use universal [place] password for that web site. If you do not have the password, please call the hospital operator.  05/27/2021, 3:19 AM

## 2021-05-27 NOTE — Assessment & Plan Note (Signed)
   Patient presenting with several day history of nausea, chills, weakness and dysuria  Patient now exhibiting multiple SIRS criteria including leukocytosis, tachypnea and fever with no evidence of organ dysfunction to suggest sepsis  Urinalysis suggestive of urinary tract infection  Treating patient with intravenous ceftriaxone  Hydrating patient with intravenous isotonic fluid  Urine cultures and blood cultures obtained

## 2021-05-27 NOTE — Assessment & Plan Note (Signed)
   Patient recently diagnosed with herpes zoster infection underneath the left breast and left lateral chest wall  Continue recently prescribed regimen of valacyclovir  On examination, rash seems to be isolated along 1 dermatome no evidence of dissemination  With no clinical evidence of dissemination airborne isolation is likely not needed.  Will defer decision on discontinuing isolation to the day team

## 2021-05-27 NOTE — Progress Notes (Signed)
rm [id number], Lauren Travis jusgt arrived from [facility], [place], Shingles & UTI. needs orders. Thanks.

## 2021-05-27 NOTE — Progress Notes (Signed)
PROGRESS NOTE    Lauren Travis  SNK:[id number] DOB: [birth year] DOA: 05/26/2021 PCP: Verdell Carmine., MD   Brief Narrative:  70 year old female with past medical history of hypertension, hyperlipidemia, peripheral neuropathy, vaginal prolapse, insomnia, prediabetes who presented to [place] emergency department with complaints of fevers as well as cloudy urine with a foul odor. Of note, patient has recently developed a vesicular rash underneath left breast and along the left chest wall.  Patient was diagnosed with shingles by her outpatient provider on Thursday 11/3. At that time patient was initiated on a course of oral Valtrex.  Assessment & Plan:   Sepsis secondary to acute cystitis without hematuria, POA Criteria met with leukocytosis tachypnea fever and cystitis Continue ceftriaxone x3 days, follow clinically Follow cultures, transition to p.o. or de-escalate antibiotics as appropriate  Herpes zoster infection, resolving, POA -Notable healing vesicular rash over the mid sternum under the left breast extending laterally without obvious vesicles or eruptions -Patient will complete Valtrex 05/27/2021 -De-escalate off contact precautions given resolving rash, clearing vesicles without active eruption  Essential hypertension, well controlled  -Continue to follow clinically, currently normotensive holding home medications  Hyperlipidemia, mixed  Continue home medications  Hypokalemia, ongoing Hyponatremia, improving Likely secondary to poor p.o. intake and losses in the setting of previous vomiting  DVT prophylaxis: Lovenox Code Status: Full Family Communication: None present  Status is: Inpatient  Dispo: The patient is from: Home              Anticipated d/c is to: Home              Anticipated d/c date is: 24 to 48 hours pending clinical course              Patient currently is not medically stable for discharge  Consultants:  None  Procedures:   None  Antimicrobials:  Ceftriaxone x3 days, Valtrex completed 05/27/2021  Subjective: No acute issues or events overnight, continues to feel somewhat fatigued weak but denies chest pain nausea vomiting diarrhea constipation headache fevers or chills  Objective: Vitals:   05/27/21 0000 05/27/21 0118 05/27/21 0325 05/27/21 0509  BP: (!) 118/58 111/67 (!) 110/57 128/66  Pulse: 61 70 73 62  Resp: (!) 23 19 16 20   Temp:  98.4 F (36.9 C) 98.6 F (37 C) 98.1 F (36.7 C)  TempSrc:  Oral Oral Oral  SpO2: 95% 96% 93% 95%  Weight:      Height:        Intake/Output Summary (Last 24 hours) at 05/27/2021 0736 Last data filed at 05/27/2021 0558 Gross per 24 hour  Intake 1329.33 ml  Output 400 ml  Net 929.33 ml   Filed Weights   05/26/21 1203  Weight: 63.5 kg    Examination:  General:  Pleasantly resting in bed, No acute distress. HEENT:  Normocephalic atraumatic.  Sclerae nonicteric, noninjected.  Extraocular movements intact bilaterally. Neck:  Without mass or deformity.  Trachea is midline. Lungs:  Clear to auscultate bilaterally without rhonchi, wheeze, or rales. Heart:  Regular rate and rhythm.  Without murmurs, rubs, or gallops. Abdomen:  Soft, nontender, nondistended.  Without guarding or rebound. Extremities: Without cyanosis, clubbing, edema, or obvious deformity. Vascular:  Dorsalis pedis and posterior tibial pulses palpable bilaterally. Skin: Healing left flank vesicles from mid sternum on the left breast without active drainage/open wounds   Data Reviewed: I have personally reviewed following labs and imaging studies  CBC: Recent Labs  Lab 05/26/21 1235 05/27/21 0651  WBC  13.3* 12.0*  NEUTROABS 10.5* 8.3*  HGB 13.7 12.0  HCT 40.5 35.6*  MCV 89.8 89.7  PLT 181 532   Basic Metabolic Panel: Recent Labs  Lab 05/26/21 1235 05/27/21 0651  NA 132* 134*  K 3.1* 2.9*  CL 93* 96*  CO2 25 27  GLUCOSE 139* 127*  BUN 12 9  CREATININE 0.87 0.78  CALCIUM 9.5  8.9  MG  --  1.5*   GFR: Estimated Creatinine Clearance: 55.7 mL/min (by C-G formula based on SCr of 0.78 mg/dL). Liver Function Tests: Recent Labs  Lab 05/26/21 1235 05/27/21 0651  AST 26 19  ALT 29 20  ALKPHOS 53 39  BILITOT 0.9 0.5  PROT 8.1 7.1  ALBUMIN 4.2 3.4*   No results for input(s): LIPASE, AMYLASE in the last 168 hours. No results for input(s): AMMONIA in the last 168 hours. Coagulation Profile: No results for input(s): INR, PROTIME in the last 168 hours. Cardiac Enzymes: No results for input(s): CKTOTAL, CKMB, CKMBINDEX, TROPONINI in the last 168 hours. BNP (last 3 results) No results for input(s): PROBNP in the last 8760 hours. HbA1C: No results for input(s): HGBA1C in the last 72 hours. CBG: No results for input(s): GLUCAP in the last 168 hours. Lipid Profile: No results for input(s): CHOL, HDL, LDLCALC, TRIG, CHOLHDL, LDLDIRECT in the last 72 hours. Thyroid Function Tests: No results for input(s): TSH, T4TOTAL, FREET4, T3FREE, THYROIDAB in the last 72 hours. Anemia Panel: No results for input(s): VITAMINB12, FOLATE, FERRITIN, TIBC, IRON, RETICCTPCT in the last 72 hours. Sepsis Labs: No results for input(s): PROCALCITON, LATICACIDVEN in the last 168 hours.  Recent Results (from the past 240 hour(s))  Rapid Influenza A&B Antigens     Status: None   Collection Time: 05/26/21 12:35 PM   Specimen: Flu Kit Nasopharyngeal Swab; Respiratory  Result Value Ref Range Status   Influenza A ([facility]) NEGATIVE NEGATIVE Final   Influenza B ([facility]) NEGATIVE NEGATIVE Final    Comment: Negative results do not exclude influenza virus infection, and influenza should still be considered if clinical suspicion is high. Performed at [facility], [address] [place], [place] [address]   Resp Panel by RT-PCR (Flu A&B, Covid) Nasopharyngeal Swab     Status: None   Collection Time: 05/26/21  4:20 PM   Specimen: Nasopharyngeal Swab; Nasopharyngeal(NP) swabs in vial  transport medium  Result Value Ref Range Status   SARS Coronavirus 2 by RT PCR NEGATIVE NEGATIVE Final    Comment: (NOTE) SARS-CoV-2 target nucleic acids are NOT DETECTED.  The SARS-CoV-2 RNA is generally detectable in upper respiratory specimens during the acute phase of infection. The lowest concentration of SARS-CoV-2 viral copies this assay can detect is 138 copies/mL. A negative result does not preclude SARS-Cov-2 infection and should not be used as the sole basis for treatment or other patient management decisions. A negative result may occur with  improper specimen collection/handling, submission of specimen other than nasopharyngeal swab, presence of viral mutation(s) within the areas targeted by this assay, and inadequate number of viral copies(<138 copies/mL). A negative result must be combined with clinical observations, patient history, and epidemiological information. The expected result is Negative.  Fact Sheet for Patients:  EntrepreneurPulse.com.au  Fact Sheet for Healthcare Providers:  IncredibleEmployment.be  This test is no t yet approved or cleared by the [place] FDA and  has been authorized for detection and/or diagnosis of SARS-CoV-2 by FDA under an Emergency Use Authorization (EUA). This EUA will remain  in  effect (meaning this test can be used) for the duration of the COVID-19 declaration under Section 564(b)(1) of the Act, 21 U.S.C.section 360bbb-3(b)(1), unless the authorization is terminated  or revoked sooner.       Influenza A by PCR NEGATIVE NEGATIVE Final   Influenza B by PCR NEGATIVE NEGATIVE Final    Comment: (NOTE) The Xpert Xpress SARS-CoV-2/FLU/RSV plus assay is intended as an aid in the diagnosis of influenza from Nasopharyngeal swab specimens and should not be used as a sole basis for treatment. Nasal washings and aspirates are unacceptable for Xpert Xpress SARS-CoV-2/FLU/RSV testing.  Fact  Sheet for Patients: EntrepreneurPulse.com.au  Fact Sheet for Healthcare Providers: IncredibleEmployment.be  This test is not yet approved or cleared by the [place] FDA and has been authorized for detection and/or diagnosis of SARS-CoV-2 by FDA under an Emergency Use Authorization (EUA). This EUA will remain in effect (meaning this test can be used) for the duration of the COVID-19 declaration under Section 564(b)(1) of the Act, 21 U.S.C. section 360bbb-3(b)(1), unless the authorization is terminated or revoked.  Performed at [facility], [address] [place], [place] [address]          Radiology Studies: DG Chest Portable 1 View  Result Date: 05/26/2021 CLINICAL DATA:  Fever. EXAM: PORTABLE CHEST 1 VIEW COMPARISON:  Chest CT May 15, 2021 FINDINGS: Tortuosity and calcific atherosclerotic disease of the aorta. Cardiomediastinal silhouette is normal. Mediastinal contours appear intact. There is no evidence of focal airspace consolidation, pleural effusion or pneumothorax. Osseous structures are without acute abnormality. Soft tissues are grossly normal. IMPRESSION: No active disease. Electronically Signed   By: Fidela Salisbury M.D.   On: 05/26/2021 13:28    Scheduled Meds:  atorvastatin  10 mg Oral Daily   enoxaparin (LOVENOX) injection  40 mg Subcutaneous Q24H   gabapentin  900 mg Oral 5 X Daily   propranolol ER  60 mg Oral Daily   valACYclovir  1,000 mg Oral BID   Continuous Infusions:  cefTRIAXone (ROCEPHIN)  IV     lactated ringers 125 mL/hr at 05/27/21 0630     LOS: 0 days   Time spent: 57mn  Darek Eifler C Anthony Tamburo, DO [facility]  If 7PM-7AM, please contact night-coverage [email or web address]  05/27/2021, 7:36 AM

## 2021-05-27 NOTE — Assessment & Plan Note (Signed)
.   Continuing home regimen of lipid lowering therapy.  

## 2021-05-27 NOTE — Plan of Care (Signed)

## 2021-05-28 DIAGNOSIS — N3000 Acute cystitis without hematuria: Secondary | ICD-10-CM | POA: Diagnosis not present

## 2021-05-28 LAB — BASIC METABOLIC PANEL
Anion gap: 8 (ref 5–15)
BUN: 10 mg/dL (ref 8–23)
CO2: 25 mmol/L (ref 22–32)
Calcium: 8.4 mg/dL — ABNORMAL LOW (ref 8.9–10.3)
Chloride: 103 mmol/L (ref 98–111)
Creatinine, Ser: 0.6 mg/dL (ref 0.44–1.00)
GFR, Estimated: >60 mL/min (ref >60)
Glucose, Bld: 101 mg/dL — ABNORMAL HIGH (ref 70–99)
Potassium: 3.5 mmol/L (ref 3.5–5.1)
Sodium: 136 mmol/L (ref 135–145)

## 2021-05-28 LAB — CBC
HCT: 35.2 % — ABNORMAL LOW (ref 36.0–46.0)
Hemoglobin: 11.8 g/dL — ABNORMAL LOW (ref 12.0–15.0)
MCH: 30.4 pg (ref 26.0–34.0)
MCHC: 33.5 g/dL (ref 30.0–36.0)
MCV: 90.7 fL (ref 80.0–100.0)
Platelets: 137 10*3/uL — ABNORMAL LOW (ref 150–400)
RBC: 3.88 MIL/uL (ref 3.87–5.11)
RDW: 14.6 % (ref 11.5–15.5)
WBC: 9 10*3/uL (ref 4.0–10.5)
nRBC: 0 % (ref 0.0–0.2)

## 2021-05-28 LAB — URINE CULTURE: Culture: >=100000 — AB

## 2021-05-28 MED ORDER — CEPHALEXIN 250 MG PO CAPS: 250.0000 mg | ORAL_CAPSULE | Freq: Four times a day (QID) | ORAL | 0 refills | Status: AC

## 2021-05-28 NOTE — Plan of Care (Signed)
All discharge instructions were given to Pt. All questions were answered.

## 2021-05-28 NOTE — Plan of Care (Signed)

## 2021-05-28 NOTE — Discharge Summary (Signed)
Physician Discharge Summary  Lauren Travis HUD:[id number] DOB: 1951-07-02 DOA: 05/26/2021  PCP: Verdell Carmine., MD  Admit date: 05/26/2021 Discharge date: 05/28/2021  Admitted From: Home Disposition: Home  Recommendations for Outpatient Follow-up:  Follow up with PCP in 1-2 weeks Please obtain BMP/CBC in one week Please follow up with urology as scheduled  Home Health: None Equipment/Devices: None  Discharge Condition: Stable CODE STATUS: Full Diet recommendation: As tolerated  Brief/Interim Summary: 70 year old female with past medical history of hypertension, hyperlipidemia, peripheral neuropathy, vaginal prolapse, insomnia, prediabetes who presented to [place] [facility] emergency department with complaints of fevers as well as cloudy urine with a foul odor. Of note, patient has recently developed a vesicular rash underneath left breast and along the left chest wall.  Patient was diagnosed with shingles by her outpatient provider on Thursday 11/3. At that time patient was initiated on a course of oral Valtrex.  Patient mid as above with acute cystitis duty Enterococcus meeting sepsis criteria with concurrent herpes zoster infection that resolved during her hospital stay, completed valacyclovir, will continue antibiotics for UTI coverage transition to Keflex for additional 2 days at discharge.  Close follow-up with urology as scheduled.  Otherwise patient stable and agreeable for discharge home, tolerating p.o. quite well without any other difficulties.   Assessment & Plan:   Sepsis secondary to acute cystitis without hematuria, POA Enterococcus identified as preliminary species Criteria met with leukocytosis tachypnea fever and cystitis Transition to Keflex as above, tolerating p.o. quite well symptoms resolving   Herpes zoster infection, resolving, POA -Notable healing vesicular rash over the mid sternum under the left breast extending laterally without obvious vesicles or  eruptions -Patient completed Valtrex 05/27/2021   Essential hypertension, well controlled  -Continue to follow clinically, currently normotensive holding home medications   Hyperlipidemia, mixed  Continue home medications   Hypokalemia, resolving Hyponatremia, improving Likely secondary to poor p.o. intake and losses in the setting of previous vomiting  Discharge Instructions   Allergies as of 05/28/2021       Reactions   Codeine Itching        Medication List     STOP taking these medications    valACYclovir 1000 MG tablet Commonly known as: VALTREX       TAKE these medications    atorvastatin 10 MG tablet Commonly known as: LIPITOR Take 10 mg by mouth daily.   cephALEXin 250 MG capsule Commonly known as: KEFLEX Take 1 capsule (250 mg total) by mouth 4 (four) times daily for 2 days.   gabapentin 300 MG capsule Commonly known as: NEURONTIN Take 900 mg by mouth 5 (five) times daily.   hydrochlorothiazide 25 MG tablet Commonly known as: HYDRODIURIL Take 25 mg by mouth daily.   potassium chloride 10 MEQ tablet Commonly known as: KLOR-CON Take 10 mEq by mouth daily.   propranolol ER 60 MG 24 hr capsule Commonly known as: INDERAL LA Take 60 mg by mouth daily.   zolpidem 5 MG tablet Commonly known as: AMBIEN Take 5 mg by mouth at bedtime as needed.        Allergies  Allergen Reactions   Codeine Itching    Consultations: None  Procedures/Studies: DG Chest Portable 1 View  Result Date: 05/26/2021 CLINICAL DATA:  Fever. EXAM: PORTABLE CHEST 1 VIEW COMPARISON:  Chest CT May 15, 2021 FINDINGS: Tortuosity and calcific atherosclerotic disease of the aorta. Cardiomediastinal silhouette is normal. Mediastinal contours appear intact. There is no evidence of focal airspace consolidation, pleural effusion or  pneumothorax. Osseous structures are without acute abnormality. Soft tissues are grossly normal. IMPRESSION: No active disease. Electronically  Signed   By: Fidela Salisbury M.D.   On: 05/26/2021 13:28     Subjective: No acute issues or events overnight tolerating p.o. quite well requesting discharge which is certainly reasonable given her drastic improvement over the past 24 hours   Discharge Exam: Vitals:   05/27/21 2049 05/28/21 0540  BP: 102/64 (!) 93/53  Pulse: 62 (!) 58  Resp: 16 16  Temp: 98.1 F (36.7 C) 98.3 F (36.8 C)  SpO2: 94% 96%   Vitals:   05/27/21 1357 05/27/21 1824 05/27/21 2049 05/28/21 0540  BP: 117/63  102/64 (!) 93/53  Pulse: 72  62 (!) 58  Resp: _0 Temp: 99.2 F (37.3 C) (!) 100.8 F (38.2 C) 98.1 F (36.7 C) 98.3 F (36.8 C)  TempSrc: Oral Oral Oral Oral  SpO2: 95%  94% 96%  Weight:      Height:        General: Pt is alert, awake, not in acute distress Cardiovascular: RRR, S1/S2 +, no rubs, no gallops Respiratory: CTA bilaterally, no wheezing, no rhonchi Abdominal: Soft, NT, ND, bowel sounds + Extremities: no edema, no cyanosis    The results of significant diagnostics from this hospitalization (including imaging, microbiology, ancillary and laboratory) are listed below for reference.     Microbiology: Recent Results (from the past 240 hour(s))  Blood culture (routine x 2)     Status: None (Preliminary result)   Collection Time: 05/26/21 12:25 PM   Specimen: Right Antecubital; Blood  Result Value Ref Range Status   Specimen Description   Final    RIGHT ANTECUBITAL BLOOD Performed at [facility], [address] [place], [place] [address]    Special Requests   Final    Blood Culture adequate volume BOTTLES DRAWN AEROBIC AND ANAEROBIC Performed at [facility], [address] [place], [place] [address]    Culture   Final    NO GROWTH 2 DAYS Performed at [facility], [place] [address] [place], [place] [address]    Report Status PENDING  Incomplete  Rapid Influenza A&B Antigens     Status: None   Collection Time: 05/26/21 12:35 PM    Specimen: Flu Kit Nasopharyngeal Swab; Respiratory  Result Value Ref Range Status   Influenza A ([facility]) NEGATIVE NEGATIVE Final   Influenza B ([facility]) NEGATIVE NEGATIVE Final    Comment: Negative results do not exclude influenza virus infection, and influenza should still be considered if clinical suspicion is high. Performed at [facility], [address] [place], [place] [address]   Blood culture (routine x 2)     Status: None (Preliminary result)   Collection Time: 05/26/21 12:38 PM   Specimen: BLOOD LEFT WRIST  Result Value Ref Range Status   Specimen Description   Final    BLOOD LEFT WRIST BLOOD Performed at [facility], [address] [place], [place] [address]    Special Requests   Final    Blood Culture adequate volume BOTTLES DRAWN AEROBIC AND ANAEROBIC Performed at [facility], [address] [place], [place] [address]    Culture   Final    NO GROWTH 2 DAYS Performed at [facility], [place] [address], [place], [place] [address]    Report Status PENDING  Incomplete  Urine Culture  Status: Abnormal   Collection Time: 05/26/21  1:10 PM   Specimen: Urine, Clean Catch  Result Value Ref Range Status   Specimen Description   Final    URINE, CLEAN CATCH Performed at [facility], [address] [place], [place] [address]    Special Requests   Final    NONE Performed at [facility], [address] [place], [place] [address]    Culture >=100,000 COLONIES/mL ESCHERICHIA COLI (A)  Final   Report Status 05/28/2021 FINAL  Final   Organism ID, Bacteria ESCHERICHIA COLI (A)  Final      Susceptibility   Escherichia coli - MIC*    AMPICILLIN <=2 SENSITIVE Sensitive     CEFAZOLIN <=4 SENSITIVE Sensitive     CEFEPIME <=0.12 SENSITIVE Sensitive     CEFTRIAXONE <=0.25 SENSITIVE Sensitive     CIPROFLOXACIN >=4 RESISTANT Resistant     GENTAMICIN <=1 SENSITIVE Sensitive     IMIPENEM <=0.25 SENSITIVE Sensitive      NITROFURANTOIN <=16 SENSITIVE Sensitive     TRIMETH/SULFA <=20 SENSITIVE Sensitive     AMPICILLIN/SULBACTAM <=2 SENSITIVE Sensitive     PIP/TAZO <=4 SENSITIVE Sensitive     * >=100,000 COLONIES/mL ESCHERICHIA COLI  Resp Panel by RT-PCR (Flu A&B, Covid) Nasopharyngeal Swab     Status: None   Collection Time: 05/26/21  4:20 PM   Specimen: Nasopharyngeal Swab; Nasopharyngeal(NP) swabs in vial transport medium  Result Value Ref Range Status   SARS Coronavirus 2 by RT PCR NEGATIVE NEGATIVE Final    Comment: (NOTE) SARS-CoV-2 target nucleic acids are NOT DETECTED.  The SARS-CoV-2 RNA is generally detectable in upper respiratory specimens during the acute phase of infection. The lowest concentration of SARS-CoV-2 viral copies this assay can detect is 138 copies/mL. A negative result does not preclude SARS-Cov-2 infection and should not be used as the sole basis for treatment or other patient management decisions. A negative result may occur with  improper specimen collection/handling, submission of specimen other than nasopharyngeal swab, presence of viral mutation(s) within the areas targeted by this assay, and inadequate number of viral copies(<138 copies/mL). A negative result must be combined with clinical observations, patient history, and epidemiological information. The expected result is Negative.  Fact Sheet for Patients:  EntrepreneurPulse.com.au  Fact Sheet for Healthcare Providers:  IncredibleEmployment.be  This test is no t yet approved or cleared by the [place] FDA and  has been authorized for detection and/or diagnosis of SARS-CoV-2 by FDA under an Emergency Use Authorization (EUA). This EUA will remain  in effect (meaning this test can be used) for the duration of the COVID-19 declaration under Section 564(b)(1) of the Act, 21 U.S.C.section 360bbb-3(b)(1), unless the authorization is terminated  or revoked sooner.        Influenza A by PCR NEGATIVE NEGATIVE Final   Influenza B by PCR NEGATIVE NEGATIVE Final    Comment: (NOTE) The Xpert Xpress SARS-CoV-2/FLU/RSV plus assay is intended as an aid in the diagnosis of influenza from Nasopharyngeal swab specimens and should not be used as a sole basis for treatment. Nasal washings and aspirates are unacceptable for Xpert Xpress SARS-CoV-2/FLU/RSV testing.  Fact Sheet for Patients: EntrepreneurPulse.com.au  Fact Sheet for Healthcare Providers: IncredibleEmployment.be  This test is not yet approved or cleared by the [place] FDA and has been authorized for detection and/or diagnosis of SARS-CoV-2 by FDA under an Emergency Use Authorization (EUA). This EUA will remain in effect (meaning this test can be used)  for the duration of the COVID-19 declaration under Section 564(b)(1) of the Act, 21 U.S.C. section 360bbb-3(b)(1), unless the authorization is terminated or revoked.  Performed at [facility], [address] [place], [place] [address]      Labs: BNP (last 3 results) No results for input(s): BNP in the last 8760 hours. Basic Metabolic Panel: Recent Labs  Lab 05/26/21 1235 05/27/21 0651 05/28/21 0328  NA 132* 134* 136  K 3.1* 2.9* 3.5  CL 93* 96* 103  CO2 _0 GLUCOSE 139* 127* 101*  BUN _1 CREATININE 0.87 0.78 0.60  CALCIUM 9.5 8.9 8.4*  MG  --  1.5*  --    Liver Function Tests: Recent Labs  Lab 05/26/21 1235 05/27/21 0651  AST 26 19  ALT 29 20  ALKPHOS 53 39  BILITOT 0.9 0.5  PROT 8.1 7.1  ALBUMIN 4.2 3.4*   No results for input(s): LIPASE, AMYLASE in the last 168 hours. No results for input(s): AMMONIA in the last 168 hours. CBC: Recent Labs  Lab 05/26/21 1235 05/27/21 0651 05/28/21 0328  WBC 13.3* 12.0* 9.0  NEUTROABS 10.5* 8.3*  --   HGB 13.7 12.0 11.8*  HCT 40.5 35.6* 35.2*  MCV 89.8 89.7 90.7  PLT 181 154 137*   Cardiac Enzymes: No results for  input(s): CKTOTAL, CKMB, CKMBINDEX, TROPONINI in the last 168 hours. BNP: Invalid input(s): POCBNP CBG: No results for input(s): GLUCAP in the last 168 hours. D-Dimer No results for input(s): DDIMER in the last 72 hours. Hgb A1c No results for input(s): HGBA1C in the last 72 hours. Lipid Profile No results for input(s): CHOL, HDL, LDLCALC, TRIG, CHOLHDL, LDLDIRECT in the last 72 hours. Thyroid function studies No results for input(s): TSH, T4TOTAL, T3FREE, THYROIDAB in the last 72 hours.  Invalid input(s): FREET3 Anemia work up No results for input(s): VITAMINB12, FOLATE, FERRITIN, TIBC, IRON, RETICCTPCT in the last 72 hours. Urinalysis    Component Value Date/Time   COLORURINE YELLOW 05/26/2021 1310   APPEARANCEUR CLOUDY (A) 05/26/2021 1310   LABSPEC 1.015 05/26/2021 1310   PHURINE 5.5 05/26/2021 1310   GLUCOSEU NEGATIVE 05/26/2021 1310   HGBUR TRACE (A) 05/26/2021 1310   BILIRUBINUR NEGATIVE 05/26/2021 1310   KETONESUR NEGATIVE 05/26/2021 1310   PROTEINUR NEGATIVE 05/26/2021 1310   NITRITE POSITIVE (A) 05/26/2021 1310   LEUKOCYTESUR MODERATE (A) 05/26/2021 1310   Sepsis Labs Invalid input(s): PROCALCITONIN,  WBC,  LACTICIDVEN Microbiology Recent Results (from the past 240 hour(s))  Blood culture (routine x 2)     Status: None (Preliminary result)   Collection Time: 05/26/21 12:25 PM   Specimen: Right Antecubital; Blood  Result Value Ref Range Status   Specimen Description   Final    RIGHT ANTECUBITAL BLOOD Performed at [facility], [address] [place], [place] [address]    Special Requests   Final    Blood Culture adequate volume BOTTLES DRAWN AEROBIC AND ANAEROBIC Performed at [facility], [address] [place], [place] [address]    Culture   Final    NO GROWTH 2 DAYS Performed at [facility], [place] [address], [place], [place] [address]    Report Status PENDING  Incomplete  Rapid Influenza A&B Antigens     Status: None    Collection Time: 05/26/21 12:35 PM   Specimen: Flu Kit Nasopharyngeal Swab; Respiratory  Result Value Ref Range Status   Influenza A [facility]) NEGATIVE NEGATIVE  Final   Influenza B ([facility]) NEGATIVE NEGATIVE Final    Comment: Negative results do not exclude influenza virus infection, and influenza should still be considered if clinical suspicion is high. Performed at [facility], [address] [place], [place] [address]   Blood culture (routine x 2)     Status: None (Preliminary result)   Collection Time: 05/26/21 12:38 PM   Specimen: BLOOD LEFT WRIST  Result Value Ref Range Status   Specimen Description   Final    BLOOD LEFT WRIST BLOOD Performed at [facility], [address] [place], [place] [address]    Special Requests   Final    Blood Culture adequate volume BOTTLES DRAWN AEROBIC AND ANAEROBIC Performed at [facility] [address] [place], [place] [address]    Culture   Final    NO GROWTH 2 DAYS Performed at [facility], [place] [address], [place], [place] [address]    Report Status PENDING  Incomplete  Urine Culture     Status: Abnormal   Collection Time: 05/26/21  1:10 PM   Specimen: Urine, Clean Catch  Result Value Ref Range Status   Specimen Description   Final    URINE, CLEAN CATCH Performed at [facility], [address] [place], [place] [address]    Special Requests   Final    NONE Performed at [facility], [address] [place], [place] [address]    Culture >=100,000 COLONIES/mL ESCHERICHIA COLI (A)  Final   Report Status 05/28/2021 FINAL  Final   Organism ID, Bacteria ESCHERICHIA COLI (A)  Final      Susceptibility   Escherichia coli - MIC*    AMPICILLIN <=2 SENSITIVE Sensitive     CEFAZOLIN <=4 SENSITIVE Sensitive     CEFEPIME <=0.12 SENSITIVE Sensitive     CEFTRIAXONE <=0.25 SENSITIVE Sensitive     CIPROFLOXACIN >=4 RESISTANT Resistant     GENTAMICIN <=1 SENSITIVE Sensitive      IMIPENEM <=0.25 SENSITIVE Sensitive     NITROFURANTOIN <=16 SENSITIVE Sensitive     TRIMETH/SULFA <=20 SENSITIVE Sensitive     AMPICILLIN/SULBACTAM <=2 SENSITIVE Sensitive     PIP/TAZO <=4 SENSITIVE Sensitive     * >=100,000 COLONIES/mL ESCHERICHIA COLI  Resp Panel by RT-PCR (Flu A&B, Covid) Nasopharyngeal Swab     Status: None   Collection Time: 05/26/21  4:20 PM   Specimen: Nasopharyngeal Swab; Nasopharyngeal(NP) swabs in vial transport medium  Result Value Ref Range Status   SARS Coronavirus 2 by RT PCR NEGATIVE NEGATIVE Final    Comment: (NOTE) SARS-CoV-2 target nucleic acids are NOT DETECTED.  The SARS-CoV-2 RNA is generally detectable in upper respiratory specimens during the acute phase of infection. The lowest concentration of SARS-CoV-2 viral copies this assay can detect is 138 copies/mL. A negative result does not preclude SARS-Cov-2 infection and should not be used as the sole basis for treatment or other patient management decisions. A negative result may occur with  improper specimen collection/handling, submission of specimen other than nasopharyngeal swab, presence of viral mutation(s) within the areas targeted by this assay, and inadequate number of viral copies(<138 copies/mL). A negative result must be combined with clinical observations, patient history, and epidemiological information. The expected result is Negative.  Fact Sheet for Patients:  EntrepreneurPulse.com.au  Fact Sheet for Healthcare Providers:  IncredibleEmployment.be  This test is no t yet approved or cleared by the [place] FDA  and  has been authorized for detection and/or diagnosis of SARS-CoV-2 by FDA under an Emergency Use Authorization (EUA). This EUA will remain  in effect (meaning this test can be used) for the duration of the COVID-19 declaration under Section 564(b)(1) of the Act, 21 U.S.C.section 360bbb-3(b)(1), unless the authorization is  terminated  or revoked sooner.       Influenza A by PCR NEGATIVE NEGATIVE Final   Influenza B by PCR NEGATIVE NEGATIVE Final    Comment: (NOTE) The Xpert Xpress SARS-CoV-2/FLU/RSV plus assay is intended as an aid in the diagnosis of influenza from Nasopharyngeal swab specimens and should not be used as a sole basis for treatment. Nasal washings and aspirates are unacceptable for Xpert Xpress SARS-CoV-2/FLU/RSV testing.  Fact Sheet for Patients: EntrepreneurPulse.com.au  Fact Sheet for Healthcare Providers: IncredibleEmployment.be  This test is not yet approved or cleared by the [place] FDA and has been authorized for detection and/or diagnosis of SARS-CoV-2 by FDA under an Emergency Use Authorization (EUA). This EUA will remain in effect (meaning this test can be used) for the duration of the COVID-19 declaration under Section 564(b)(1) of the Act, 21 U.S.C. section 360bbb-3(b)(1), unless the authorization is terminated or revoked.  Performed at [facility] [address] [place], [place] [address]      Time coordinating discharge: Over 30 minutes  SIGNED:   Little Ishikawa, DO [facility] 05/28/2021, 10:56 AM Pager   If 7PM-7AM, please contact night-coverage [email or web address]

## 2021-05-31 LAB — CULTURE, BLOOD (ROUTINE X 2)
Culture: NO GROWTH
Culture: NO GROWTH
Special Requests: ADEQUATE
Special Requests: ADEQUATE

## 2022-08-26 IMAGING — DX DG CHEST 1V PORT
1 series · 1 of 1 positions shown · non-contrast
Comparison: Chest CT May 15, 2021

CLINICAL DATA: Fever.

EXAM:
PORTABLE CHEST 1 VIEW

[chest ap]
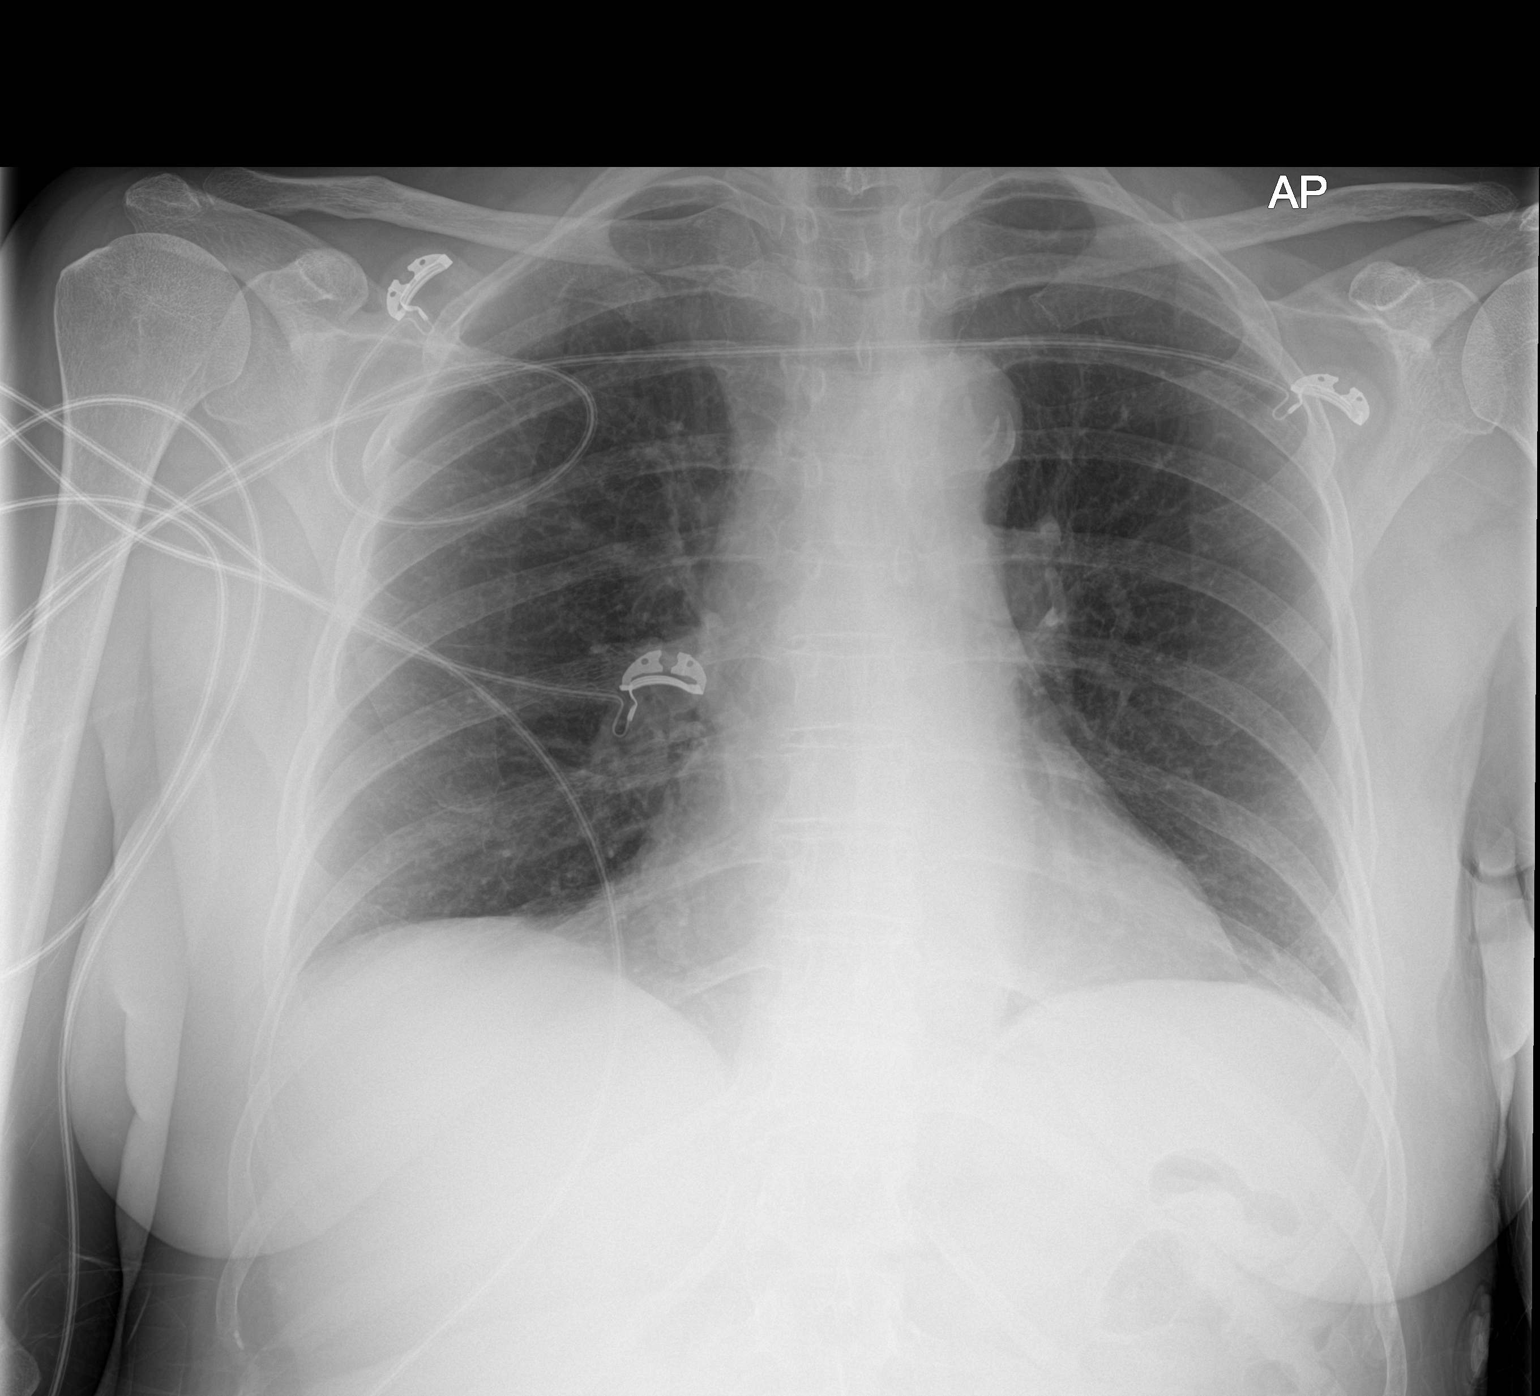

[1 of 1 positions shown; findings below may reference images not displayed]

FINDINGS: Tortuosity and calcific atherosclerotic disease of the aorta.

Cardiomediastinal silhouette is normal. Mediastinal contours appear
intact.

There is no evidence of focal airspace consolidation, pleural
effusion or pneumothorax.

Osseous structures are without acute abnormality. Soft tissues are
grossly normal.
IMPRESSION: No active disease.

## 2022-10-18 ENCOUNTER — Encounter (HOSPITAL_BASED_OUTPATIENT_CLINIC_OR_DEPARTMENT_OTHER): Payer: Self-pay | Admitting: Emergency Medicine

## 2022-10-18 ENCOUNTER — Emergency Department (HOSPITAL_BASED_OUTPATIENT_CLINIC_OR_DEPARTMENT_OTHER): Payer: Medicare Other

## 2022-10-18 ENCOUNTER — Emergency Department (HOSPITAL_BASED_OUTPATIENT_CLINIC_OR_DEPARTMENT_OTHER)
Admission: EM | Admit: 2022-10-18 | Discharge: 2022-10-18 | Disposition: A | Discharge status: Home / Self Care | Payer: Medicare Other | Attending: Emergency Medicine | Admitting: Emergency Medicine

## 2022-10-18 ENCOUNTER — Other Ambulatory Visit: Payer: Self-pay

## 2022-10-18 DIAGNOSIS — N3001 Acute cystitis with hematuria: Secondary | ICD-10-CM | POA: Diagnosis not present

## 2022-10-18 DIAGNOSIS — D72829 Elevated white blood cell count, unspecified: Secondary | ICD-10-CM | POA: Insufficient documentation

## 2022-10-18 DIAGNOSIS — R3915 Urgency of urination: Secondary | ICD-10-CM | POA: Diagnosis present

## 2022-10-18 HISTORY — DX: Urinary tract infection, site not specified: N39.0

## 2022-10-18 HISTORY — DX: Sepsis, unspecified organism: A41.9

## 2022-10-18 LAB — COMPREHENSIVE METABOLIC PANEL
ALT: 23 U/L (ref 0–44)
AST: 25 U/L (ref 15–41)
Albumin: 4.3 g/dL (ref 3.5–5.0)
Alkaline Phosphatase: 54 U/L (ref 38–126)
Anion gap: 9 (ref 5–15)
BUN: 12 mg/dL (ref 8–23)
CO2: 22 mmol/L (ref 22–32)
Calcium: 9.6 mg/dL (ref 8.9–10.3)
Chloride: 103 mmol/L (ref 98–111)
Creatinine, Ser: 0.85 mg/dL (ref 0.44–1.00)
GFR, Estimated: >60 mL/min (ref >60)
Glucose, Bld: 116 mg/dL — ABNORMAL HIGH (ref 70–99)
Potassium: 3.8 mmol/L (ref 3.5–5.1)
Sodium: 134 mmol/L — ABNORMAL LOW (ref 135–145)
Total Bilirubin: 0.5 mg/dL (ref 0.3–1.2)
Total Protein: 8.1 g/dL (ref 6.5–8.1)

## 2022-10-18 LAB — CBC WITH DIFFERENTIAL/PLATELET
Abs Immature Granulocytes: 0.04 10*3/uL (ref 0.00–0.07)
Basophils Absolute: 0 10*3/uL (ref 0.0–0.1)
Basophils Relative: 0 %
Eosinophils Absolute: 0.1 10*3/uL (ref 0.0–0.5)
Eosinophils Relative: 1 %
HCT: 40.8 % (ref 36.0–46.0)
Hemoglobin: 13.4 g/dL (ref 12.0–15.0)
Immature Granulocytes: 0 %
Lymphocytes Relative: 15 %
Lymphs Abs: 1.6 10*3/uL (ref 0.7–4.0)
MCH: 29.4 pg (ref 26.0–34.0)
MCHC: 32.8 g/dL (ref 30.0–36.0)
MCV: 89.5 fL (ref 80.0–100.0)
Monocytes Absolute: 0.8 10*3/uL (ref 0.1–1.0)
Monocytes Relative: 7 %
Neutro Abs: 8.4 10*3/uL — ABNORMAL HIGH (ref 1.7–7.7)
Neutrophils Relative %: 77 %
Platelets: 190 10*3/uL (ref 150–400)
RBC: 4.56 MIL/uL (ref 3.87–5.11)
RDW: 14 % (ref 11.5–15.5)
WBC: 10.9 10*3/uL — ABNORMAL HIGH (ref 4.0–10.5)
nRBC: 0 % (ref 0.0–0.2)

## 2022-10-18 LAB — URINALYSIS, ROUTINE W REFLEX MICROSCOPIC
Bilirubin Urine: NEGATIVE
Glucose, UA: NEGATIVE mg/dL
Ketones, ur: NEGATIVE mg/dL
Nitrite: NEGATIVE
Protein, ur: 100 mg/dL — AB
Specific Gravity, Urine: 1.01 (ref 1.005–1.030)
pH: 5.5 (ref 5.0–8.0)

## 2022-10-18 LAB — URINALYSIS, MICROSCOPIC (REFLEX): WBC, UA: >50 WBC/hpf (ref 0–5)

## 2022-10-18 LAB — LACTIC ACID, PLASMA: Lactic Acid, Venous: 1.4 mmol/L (ref 0.5–1.9)

## 2022-10-18 LAB — LIPASE, BLOOD: Lipase: 29 U/L (ref 11–51)

## 2022-10-18 MED ORDER — SODIUM CHLORIDE 0.9 % IV SOLN
1.0000 g | Freq: Once | INTRAVENOUS | Status: AC
  Administered 2022-10-18: 1 g via INTRAVENOUS
  Filled 2022-10-18: qty 10

## 2022-10-18 MED ORDER — CEPHALEXIN 500 MG PO CAPS: 500.0000 mg | ORAL_CAPSULE | Freq: Three times a day (TID) | ORAL | 0 refills | Status: AC

## 2022-10-18 MED ORDER — SODIUM CHLORIDE 0.9 % IV BOLUS
1000.0000 mL | Freq: Once | INTRAVENOUS | Status: AC
  Administered 2022-10-18: 1000 mL via INTRAVENOUS

## 2022-10-18 NOTE — ED Triage Notes (Signed)
Patient c/o blood in urine, decreased urine output and feeling pressure. Patient has history of complex UTI that has turned into sepsis. Last UTI was 3 weeks ago, treated with Cipro and Cefdinir.

## 2022-10-18 NOTE — Discharge Instructions (Signed)
Take the antibiotics as prescribed.  Follow-up with your doctor.  As we discussed blood and urine cultures are pending and you will be contacted if these results become positive and antibiotic needs to be changed.  Return to the ED with worsening symptoms, fever, abdominal pain, vomiting, not able to urinate, confusion or any other concerns.

## 2022-10-18 NOTE — ED Provider Notes (Signed)
[facility] Provider Note   CSN: [id number] Arrival date & time: 10/18/22  [id number]     History  Chief Complaint  Patient presents with   Hematuria    Lauren Travis is a 72 y.o. female.  Patient presents with concern for UTI.  She does have a history of recurrent UTIs with hospitalization for sepsis in 2022.  Since waking up this morning at 4 AM she has had urinary pressure, urgency, frequency, hematuria and burning with urination.  Denies any abdominal pain or back pain or flank pain.  Nausea but no vomiting.  No fever.  No chest pain or shortness of breath.  Was active during the day yesterday during her normal activities.  Daughter at bedside feels whenever she is active her UTIs seem to get worse. She was treated by her PCP about 3 weeks ago for UTI that was resistant to Cipro but completed a course of cefdinir. Has had kidney stones remotely and multiple previous abdominal and surgeries.  The history is provided by the patient and a relative.  Hematuria Pertinent negatives include no chest pain, no abdominal pain, no headaches and no shortness of breath.       Home Medications Prior to Admission medications   Medication Sig Start Date End Date Taking? Authorizing Provider  atorvastatin (LIPITOR) 10 MG tablet Take 10 mg by mouth daily. 04/01/21   [provider]  Estrogens Conjugated (PREMARIN PO) Take by mouth.    [provider]  Gabapentin (NEURONTIN PO) Take by mouth.    [provider]  gabapentin (NEURONTIN) 300 MG capsule Take 900 mg by mouth 5 (five) times daily. 04/01/21   [provider]  hydrochlorothiazide (HYDRODIURIL) 25 MG tablet Take 25 mg by mouth daily. 04/01/21   [provider]  potassium chloride (KLOR-CON) 10 MEQ tablet Take 10 mEq by mouth daily. 01/05/21   [provider]  propranolol ER (INDERAL LA) 60 MG 24 hr capsule Take 60 mg by mouth daily. 04/01/21    [provider]  PROPRANOLOL HCL PO Take by mouth.    [provider]  zolpidem (AMBIEN) 5 MG tablet Take 5 mg by mouth at bedtime as needed. 04/29/21   [provider]      Allergies    Bactrim [sulfamethoxazole-trimethoprim], Morphine and related, and Codeine    Review of Systems   Review of Systems  Constitutional:  Positive for fatigue. Negative for activity change, appetite change and fever.  HENT:  Negative for congestion and rhinorrhea.   Respiratory:  Negative for cough, chest tightness and shortness of breath.   Cardiovascular:  Negative for chest pain.  Gastrointestinal:  Positive for nausea. Negative for abdominal pain and vomiting.  Genitourinary:  Positive for dysuria, frequency, hematuria and urgency. Negative for decreased urine volume and flank pain.  Musculoskeletal:  Negative for arthralgias and myalgias.  Skin:  Negative for rash.  Neurological:  Negative for dizziness, weakness, light-headedness and headaches.   all other systems are negative except as noted in the HPI and PMH.    Physical Exam Updated Vital Signs BP (!) 169/76   Pulse 68   Temp 97.9 F (36.6 C) (Oral)   Ht 5\' 2"  (1.575 m)   Wt 64.4 kg   SpO2 100%   BMI 25.97 kg/m  Physical Exam Vitals and nursing note reviewed.  Constitutional:      General: She is not in acute distress.    Appearance: She is  well-developed. She is not ill-appearing.  HENT:     Head: Normocephalic and atraumatic.     Mouth/Throat:     Pharynx: No oropharyngeal exudate.  Eyes:     Conjunctiva/sclera: Conjunctivae normal.     Pupils: Pupils are equal, round, and reactive to light.  Neck:     Comments: No meningismus. Cardiovascular:     Rate and Rhythm: Normal rate and regular rhythm.     Heart sounds: Normal heart sounds. No murmur heard. Pulmonary:     Effort: Pulmonary effort is normal. No respiratory distress.     Breath sounds: Normal breath sounds.  Abdominal:     Palpations:  Abdomen is soft.     Tenderness: There is no abdominal tenderness. There is no guarding or rebound.     Comments: Mild suprapubic tenderness.  Musculoskeletal:        General: No tenderness. Normal range of motion.     Cervical back: Normal range of motion and neck supple.     Comments: No CVA tenderness  Skin:    General: Skin is warm.  Neurological:     Mental Status: She is alert and oriented to person, place, and time.     Cranial Nerves: No cranial nerve deficit.     Motor: No abnormal muscle tone.     Coordination: Coordination normal.     Comments:  5/5 strength throughout. CN 2-12 intact.Equal grip strength.   Psychiatric:        Behavior: Behavior normal.     ED Results / Procedures / Treatments   Labs (all labs ordered are listed, but only abnormal results are displayed) Labs Reviewed  URINALYSIS, ROUTINE W REFLEX MICROSCOPIC - Abnormal; Notable for the following components:      Result Value   APPearance CLOUDY (*)    Hgb urine dipstick LARGE (*)    Protein, ur 100 (*)    Leukocytes,Ua MODERATE (*)    All other components within normal limits  CBC WITH DIFFERENTIAL/PLATELET - Abnormal; Notable for the following components:   WBC 10.9 (*)    Neutro Abs 8.4 (*)    All other components within normal limits  COMPREHENSIVE METABOLIC PANEL - Abnormal; Notable for the following components:   Sodium 134 (*)    Glucose, Bld 116 (*)    All other components within normal limits  URINALYSIS, MICROSCOPIC (REFLEX) - Abnormal; Notable for the following components:   Bacteria, UA MANY (*)    All other components within normal limits  CULTURE, BLOOD (ROUTINE X 2)  CULTURE, BLOOD (ROUTINE X 2)  URINE CULTURE  LACTIC ACID, PLASMA  LIPASE, BLOOD    EKG None  Radiology CT Renal Stone Study  Result Date: 10/18/2022 CLINICAL DATA:  Abdominal pain/flank pain. EXAM: CT ABDOMEN AND PELVIS WITHOUT CONTRAST TECHNIQUE: Multidetector CT imaging of the abdomen and pelvis was  performed following the standard protocol without IV contrast. RADIATION DOSE REDUCTION: This exam was performed according to the departmental dose-optimization program which includes automated exposure control, adjustment of the mA and/or kV according to patient size and/or use of iterative reconstruction technique. COMPARISON:  None Available. FINDINGS: Lower chest: No acute abnormality. Hepatobiliary: No focal liver abnormality is seen. No gallstones, gallbladder wall thickening, or biliary dilatation. Pancreas: Unremarkable. No pancreatic ductal dilatation or surrounding inflammatory changes. Spleen: Normal in size without focal abnormality. Adrenals/Urinary Tract: Normal adrenal glands. No nephrolithiasis, hydronephrosis or mass identified bilaterally. No ureteral calculi or bladder calculi. Mild bladder wall thickening with surrounding haziness is  identified which may reflect underlying cystitis, image 65/2. Small cystocele. Stomach/Bowel: Stomach appears normal. Status post right hemicolectomy with enterocolonic anastomosis. Distal colonic diverticula identified without signs of acute diverticulitis. No bowel wall thickening, inflammation or distension. Vascular/Lymphatic: Aortic atherosclerosis. No signs of abdominopelvic adenopathy. Reproductive: Status post hysterectomy. No adnexal masses. Other: No free fluid or fluid collections. Musculoskeletal: No acute or suspicious osseous findings. 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 IMPRESSION: 1. Mild bladder wall thickening with surrounding haziness is identified which may reflect underlying cystitis. Correlate for any clinical signs or symptoms of cystitis. 2. No signs of nephrolithiasis or obstructive uropathy. 3. Distal colonic diverticulosis without signs of acute diverticulitis. 4. Small cystocele. 5.  Aortic Atherosclerosis (ICD10-I70.0). Electronically Signed   By: Kerby Moors M.D.   On: 10/18/2022 09:52    Procedures Procedures     Medications Ordered in ED Medications - No data to display  ED Course/ Medical Decision Making/ A&P                             Medical Decision Making Amount and/or Complexity of Data Reviewed Labs: ordered. Decision-making details documented in ED Course. Radiology: ordered and independent interpretation performed. Decision-making details documented in ED Course. ECG/medicine tests: ordered and independent interpretation performed. Decision-making details documented in ED Course.  Risk Prescription drug management.   UTI symptoms with concern for recurrent infection.  Vital stable, no distress, no fever.  She appears well.  Will obtain urinalysis, blood work given her concerns for possible early sepsis.  Urine culture from March 6 reviewed and grew E. coli that was resistant to Cipro but sensitive to other antibiotics.  Labs show mild leukocytosis of 10.9.  Lactate is normal.  Creatinine is normal.  Urinalysis concerning for infection with many bacteria and white cells.  Will send for culture. IV Rocephin to be given.  CT scan obtained to rule out obstructive uropathy.  Does show evidence of cystitis but no hydronephrosis or evidence of obstructive uropathy.  Results reviewed and interpreted by me.  Patient given IV Rocephin as well as IV fluids.  She is tolerating p.o.  She appears well.  No fever.  Vital signs are stable.  No evidence of sepsis at this time.  She is comfortable with plan to discharge home while blood and urine cultures are pending.  Will treat with Keflex which urine was sensitive to on last culture.  Return precautions discussed including worsening pain, fever, vomiting, not able to eat or drink or any other concerns.         Final Clinical Impression(s) / ED Diagnoses Final diagnoses:  Acute cystitis with hematuria    Rx / DC Orders ED Discharge Orders     None         Josue Kass, Annie Main, MD 10/18/22 1027

## 2022-10-18 NOTE — ED Notes (Signed)
Bladder scan completed. Scanned pt 4 times, all ranging from 170 mL-198 mL.

## 2022-10-18 NOTE — ED Notes (Signed)
ED Provider at bedside. 

## 2022-10-20 LAB — URINE CULTURE: Culture: 50000 — AB

## 2022-10-21 ENCOUNTER — Telehealth (HOSPITAL_BASED_OUTPATIENT_CLINIC_OR_DEPARTMENT_OTHER): Payer: Self-pay | Admitting: *Deleted

## 2022-10-21 NOTE — Telephone Encounter (Signed)
Post ED Visit - Positive Culture Follow-up  Culture report reviewed by antimicrobial stewardship pharmacist: [place] Team []  Elenor Quinones, Pharm.D. []  Heide Guile, Pharm.D., BCPS AQ-ID []  Parks Neptune, Pharm.D., BCPS []  Alycia Rossetti, Pharm.D., BCPS []  [place], Pharm.D., BCPS, AAHIVP []  Legrand Como, Pharm.D., BCPS, AAHIVP []  Salome Arnt, PharmD, BCPS []  Johnnette Gourd, PharmD, BCPS []  Hughes Better, PharmD, BCPS []  Leeroy Cha, PharmD []  Laqueta Linden, PharmD, BCPS []  Albertina Parr, PharmD  [place] Team []  Leodis Sias, PharmD []  Lindell Spar, PharmD []  Royetta Asal, PharmD []  Graylin Shiver, Rph []  Rema Fendt) Glennon Mac, PharmD []  Arlyn Dunning, PharmD []  Netta Cedars, PharmD []  Dia Sitter, PharmD []  Leone Haven, PharmD []  Gretta Arab, PharmD []  Theodis Shove, PharmD []  Peggyann Juba, PharmD []  Reuel Boom, PharmD   Positive urine culture Treated with Ce[halexin, organism sensitive to the same and no further patient follow-up is required at this time.  Bertis Ruddy, PharmD  Harlon Flor Talley 10/21/2022, 8:51 AM

## 2022-10-23 LAB — CULTURE, BLOOD (ROUTINE X 2)
Culture: NO GROWTH
Culture: NO GROWTH
Special Requests: ADEQUATE

## 2024-02-22 ENCOUNTER — Other Ambulatory Visit (HOSPITAL_COMMUNITY): Payer: Self-pay
